# Patient Record
Sex: Female | Born: 1935
Health system: Southern US, Community
[De-identification: ages and names within clinical notes are randomized; demographics above are authoritative.]

## PROBLEM LIST (undated history)

## (undated) DIAGNOSIS — I82409 Acute embolism and thrombosis of unspecified deep veins of unspecified lower extremity: Secondary | ICD-10-CM

## (undated) DIAGNOSIS — C50911 Malignant neoplasm of unspecified site of right female breast: Secondary | ICD-10-CM

## (undated) DIAGNOSIS — I10 Essential (primary) hypertension: Secondary | ICD-10-CM

## (undated) DIAGNOSIS — R519 Headache, unspecified: Secondary | ICD-10-CM

## (undated) DIAGNOSIS — E039 Hypothyroidism, unspecified: Secondary | ICD-10-CM

## (undated) DIAGNOSIS — R51 Headache: Secondary | ICD-10-CM

## (undated) DIAGNOSIS — D473 Essential (hemorrhagic) thrombocythemia: Secondary | ICD-10-CM

## (undated) HISTORY — PX: BREAST BIOPSY: SHX20

## (undated) HISTORY — DX: Essential (hemorrhagic) thrombocythemia: D47.3

## (undated) HISTORY — PX: CATARACT EXTRACTION W/ INTRAOCULAR LENS  IMPLANT, BILATERAL: SHX1307

## (undated) HISTORY — PX: MASTECTOMY: SHX3

## (undated) HISTORY — PX: REDUCTION MAMMAPLASTY: SUR839

## (undated) HISTORY — PX: DILATION AND CURETTAGE OF UTERUS: SHX78

## (undated) HISTORY — PX: VAGINAL HYSTERECTOMY: SUR661

---

## 1982-10-08 DIAGNOSIS — C50311 Malignant neoplasm of lower-inner quadrant of right female breast: Secondary | ICD-10-CM | POA: Insufficient documentation

## 1999-01-27 ENCOUNTER — Encounter: Admission: RE | Admit: 1999-01-27 | Discharge: 1999-01-27 | Payer: Self-pay | Admitting: *Deleted

## 1999-03-08 ENCOUNTER — Encounter: Payer: Self-pay | Admitting: Orthopedic Surgery

## 1999-03-15 ENCOUNTER — Observation Stay (HOSPITAL_COMMUNITY): Admission: RE | Admit: 1999-03-15 | Discharge: 1999-03-16 | Payer: Self-pay | Admitting: Orthopedic Surgery

## 1999-10-13 ENCOUNTER — Other Ambulatory Visit: Admission: RE | Admit: 1999-10-13 | Discharge: 1999-10-13 | Payer: Self-pay | Admitting: Plastic Surgery

## 1999-10-13 ENCOUNTER — Encounter (INDEPENDENT_AMBULATORY_CARE_PROVIDER_SITE_OTHER): Payer: Self-pay

## 2000-02-03 ENCOUNTER — Encounter: Admission: RE | Admit: 2000-02-03 | Discharge: 2000-02-03 | Payer: Self-pay | Admitting: Internal Medicine

## 2000-02-03 ENCOUNTER — Encounter: Payer: Self-pay | Admitting: Internal Medicine

## 2001-02-04 ENCOUNTER — Encounter: Payer: Self-pay | Admitting: Internal Medicine

## 2001-02-04 ENCOUNTER — Encounter: Admission: RE | Admit: 2001-02-04 | Discharge: 2001-02-04 | Payer: Self-pay | Admitting: Internal Medicine

## 2001-03-06 ENCOUNTER — Ambulatory Visit (HOSPITAL_COMMUNITY): Admission: RE | Admit: 2001-03-06 | Discharge: 2001-03-06 | Payer: Self-pay | Admitting: *Deleted

## 2001-03-06 ENCOUNTER — Encounter (INDEPENDENT_AMBULATORY_CARE_PROVIDER_SITE_OTHER): Payer: Self-pay | Admitting: Specialist

## 2002-02-18 ENCOUNTER — Encounter: Payer: Self-pay | Admitting: Internal Medicine

## 2002-02-18 ENCOUNTER — Encounter: Admission: RE | Admit: 2002-02-18 | Discharge: 2002-02-18 | Payer: Self-pay | Admitting: Internal Medicine

## 2003-02-27 ENCOUNTER — Encounter: Admission: RE | Admit: 2003-02-27 | Discharge: 2003-02-27 | Payer: Self-pay | Admitting: Internal Medicine

## 2004-01-20 ENCOUNTER — Emergency Department (HOSPITAL_COMMUNITY): Admission: EM | Admit: 2004-01-20 | Discharge: 2004-01-20 | Payer: Self-pay | Admitting: Family Medicine

## 2004-01-21 ENCOUNTER — Emergency Department (HOSPITAL_COMMUNITY): Admission: EM | Admit: 2004-01-21 | Discharge: 2004-01-21 | Payer: Self-pay | Admitting: Emergency Medicine

## 2004-01-21 ENCOUNTER — Ambulatory Visit (HOSPITAL_COMMUNITY): Admission: RE | Admit: 2004-01-21 | Discharge: 2004-01-21 | Payer: Self-pay | Admitting: Sports Medicine

## 2004-02-11 ENCOUNTER — Ambulatory Visit (HOSPITAL_COMMUNITY): Admission: RE | Admit: 2004-02-11 | Discharge: 2004-02-11 | Payer: Self-pay | Admitting: Internal Medicine

## 2004-04-01 ENCOUNTER — Ambulatory Visit (HOSPITAL_COMMUNITY): Admission: RE | Admit: 2004-04-01 | Discharge: 2004-04-01 | Payer: Self-pay | Admitting: Internal Medicine

## 2004-07-07 ENCOUNTER — Encounter: Admission: RE | Admit: 2004-07-07 | Discharge: 2004-07-07 | Payer: Self-pay | Admitting: Internal Medicine

## 2005-04-06 ENCOUNTER — Encounter: Admission: RE | Admit: 2005-04-06 | Discharge: 2005-04-06 | Payer: Self-pay | Admitting: Internal Medicine

## 2005-05-31 ENCOUNTER — Encounter: Admission: RE | Admit: 2005-05-31 | Discharge: 2005-05-31 | Payer: Self-pay | Admitting: Internal Medicine

## 2006-04-09 ENCOUNTER — Ambulatory Visit (HOSPITAL_COMMUNITY): Admission: RE | Admit: 2006-04-09 | Discharge: 2006-04-09 | Payer: Self-pay | Admitting: Internal Medicine

## 2007-04-11 ENCOUNTER — Ambulatory Visit (HOSPITAL_COMMUNITY): Admission: RE | Admit: 2007-04-11 | Discharge: 2007-04-11 | Payer: Self-pay | Admitting: Internal Medicine

## 2008-04-13 ENCOUNTER — Ambulatory Visit (HOSPITAL_COMMUNITY): Admission: RE | Admit: 2008-04-13 | Discharge: 2008-04-13 | Payer: Self-pay | Admitting: Internal Medicine

## 2008-09-26 ENCOUNTER — Emergency Department (HOSPITAL_COMMUNITY): Admission: EM | Admit: 2008-09-26 | Discharge: 2008-09-26 | Payer: Self-pay | Admitting: Emergency Medicine

## 2008-10-02 ENCOUNTER — Encounter: Admission: RE | Admit: 2008-10-02 | Discharge: 2008-10-02 | Payer: Self-pay | Admitting: Internal Medicine

## 2008-11-03 ENCOUNTER — Encounter: Admission: RE | Admit: 2008-11-03 | Discharge: 2008-11-03 | Payer: Self-pay | Admitting: Endocrinology

## 2008-11-03 ENCOUNTER — Encounter (INDEPENDENT_AMBULATORY_CARE_PROVIDER_SITE_OTHER): Payer: Self-pay | Admitting: Interventional Radiology

## 2008-11-03 ENCOUNTER — Other Ambulatory Visit: Admission: RE | Admit: 2008-11-03 | Discharge: 2008-11-03 | Payer: Self-pay | Admitting: Interventional Radiology

## 2009-05-04 ENCOUNTER — Ambulatory Visit (HOSPITAL_COMMUNITY): Admission: RE | Admit: 2009-05-04 | Discharge: 2009-05-04 | Payer: Self-pay | Admitting: Internal Medicine

## 2010-03-14 ENCOUNTER — Ambulatory Visit: Payer: Self-pay | Admitting: Oncology

## 2010-03-29 ENCOUNTER — Other Ambulatory Visit: Payer: Self-pay | Admitting: Oncology

## 2010-03-29 ENCOUNTER — Encounter (HOSPITAL_BASED_OUTPATIENT_CLINIC_OR_DEPARTMENT_OTHER): Payer: Medicare Other | Admitting: Oncology

## 2010-03-29 DIAGNOSIS — C50919 Malignant neoplasm of unspecified site of unspecified female breast: Secondary | ICD-10-CM

## 2010-03-29 DIAGNOSIS — D473 Essential (hemorrhagic) thrombocythemia: Secondary | ICD-10-CM

## 2010-03-29 DIAGNOSIS — D6859 Other primary thrombophilia: Secondary | ICD-10-CM

## 2010-03-29 LAB — CBC WITH DIFFERENTIAL/PLATELET
BASO%: 0.4 % (ref 0.0–2.0)
Basophils Absolute: 0 10*3/uL (ref 0.0–0.1)
EOS%: 2.3 % (ref 0.0–7.0)
Eosinophils Absolute: 0.2 10*3/uL (ref 0.0–0.5)
HCT: 46.5 % (ref 34.8–46.6)
LYMPH%: 26.1 % (ref 14.0–49.7)
MCHC: 32.3 g/dL (ref 31.5–36.0)
MONO#: 0.5 10*3/uL (ref 0.1–0.9)
NEUT#: 4.8 10*3/uL (ref 1.5–6.5)
NEUT%: 64.3 % (ref 38.4–76.8)
Platelets: 781 10*3/uL — ABNORMAL HIGH (ref 145–400)
RDW: 18.7 % — ABNORMAL HIGH (ref 11.2–14.5)
lymph#: 1.9 10*3/uL (ref 0.9–3.3)

## 2010-03-29 LAB — COMPREHENSIVE METABOLIC PANEL
AST: 26 U/L (ref 0–37)
Calcium: 9.3 mg/dL (ref 8.4–10.5)
Chloride: 107 mEq/L (ref 96–112)
Sodium: 143 mEq/L (ref 135–145)
Total Protein: 7.1 g/dL (ref 6.0–8.3)

## 2010-03-29 LAB — MORPHOLOGY

## 2010-03-30 ENCOUNTER — Other Ambulatory Visit (HOSPITAL_COMMUNITY): Payer: Self-pay | Admitting: Internal Medicine

## 2010-03-30 DIAGNOSIS — Z1231 Encounter for screening mammogram for malignant neoplasm of breast: Secondary | ICD-10-CM

## 2010-04-01 LAB — HYPERCOAGULABLE PANEL, COMPREHENSIVE
Anticardiolipin IgA: 3 APL U/mL (ref <22)
Anticardiolipin IgM: 19 MPL U/mL — ABNORMAL HIGH (ref <11)
Beta-2-Glycoprotein I IgA: 8 A Units (ref <20)
Beta-2-Glycoprotein I IgM: 34 M Units — ABNORMAL HIGH (ref <20)
DRVVT 1:1 Mix: 41 secs (ref 36.2–44.3)
PTT Lupus Anticoagulant: 48.6 secs — ABNORMAL HIGH (ref 30.0–45.6)
PTTLA 4:1 Mix: 40.4 secs (ref 30.0–45.6)
Protein C, Total: 63 % — ABNORMAL LOW (ref 70–140)

## 2010-04-01 LAB — IRON AND TIBC: %SAT: 26 % (ref 20–55)

## 2010-04-01 LAB — CANCER ANTIGEN 27.29: CA 27.29: 27 U/mL (ref 0–39)

## 2010-04-11 LAB — HYPERCOAGULABLE PANEL, COMPREHENSIVE
Anticardiolipin IgA: 3 APL U/mL (ref <22)
Beta-2-Glycoprotein I IgM: 34 M Units — ABNORMAL HIGH (ref <20)
DRVVT 1:1 Mix: 41 secs (ref 36.2–44.3)
DRVVT: 59.1 secs — ABNORMAL HIGH (ref 36.2–44.3)
PTTLA 4:1 Mix: 40.4 secs (ref 30.0–45.6)
Protein C Activity: 18 % — ABNORMAL LOW (ref 75–133)
Protein C, Total: 63 % — ABNORMAL LOW (ref 70–140)
Protein S Activity: 34 % — ABNORMAL LOW (ref 69–129)

## 2010-04-11 LAB — IRON AND TIBC
%SAT: 26 % (ref 20–55)
Iron: 74 ug/dL (ref 42–145)

## 2010-04-11 LAB — FERRITIN: Ferritin: 110 ng/mL (ref 10–291)

## 2010-04-15 ENCOUNTER — Encounter (HOSPITAL_BASED_OUTPATIENT_CLINIC_OR_DEPARTMENT_OTHER): Payer: Medicare Other | Admitting: Oncology

## 2010-04-15 DIAGNOSIS — D6859 Other primary thrombophilia: Secondary | ICD-10-CM

## 2010-04-15 DIAGNOSIS — C50919 Malignant neoplasm of unspecified site of unspecified female breast: Secondary | ICD-10-CM

## 2010-04-15 DIAGNOSIS — D473 Essential (hemorrhagic) thrombocythemia: Secondary | ICD-10-CM

## 2010-04-20 ENCOUNTER — Other Ambulatory Visit: Payer: Self-pay | Admitting: Oncology

## 2010-04-20 DIAGNOSIS — D75839 Thrombocytosis, unspecified: Secondary | ICD-10-CM

## 2010-04-20 DIAGNOSIS — D473 Essential (hemorrhagic) thrombocythemia: Secondary | ICD-10-CM

## 2010-04-26 ENCOUNTER — Other Ambulatory Visit: Payer: Self-pay | Admitting: Oncology

## 2010-04-26 ENCOUNTER — Other Ambulatory Visit: Payer: Self-pay | Admitting: Diagnostic Radiology

## 2010-04-26 ENCOUNTER — Ambulatory Visit (HOSPITAL_COMMUNITY)
Admission: RE | Admit: 2010-04-26 | Discharge: 2010-04-26 | Disposition: A | Discharge status: Home / Self Care | Payer: Medicare Other | Source: Ambulatory Visit | Attending: Oncology | Admitting: Oncology

## 2010-04-26 ENCOUNTER — Ambulatory Visit (HOSPITAL_COMMUNITY): Payer: Medicare Other

## 2010-04-26 DIAGNOSIS — D473 Essential (hemorrhagic) thrombocythemia: Secondary | ICD-10-CM | POA: Insufficient documentation

## 2010-04-26 DIAGNOSIS — D75839 Thrombocytosis, unspecified: Secondary | ICD-10-CM

## 2010-04-26 LAB — CBC
HCT: 44.8 % (ref 36.0–46.0)
MCH: 26.8 pg (ref 26.0–34.0)
MCV: 81.8 fL (ref 78.0–100.0)
RBC: 5.48 MIL/uL — ABNORMAL HIGH (ref 3.87–5.11)
WBC: 9.7 10*3/uL (ref 4.0–10.5)

## 2010-04-26 LAB — APTT: aPTT: 32 seconds (ref 24–37)

## 2010-04-27 ENCOUNTER — Other Ambulatory Visit (HOSPITAL_COMMUNITY): Payer: Medicare Other

## 2010-05-09 ENCOUNTER — Ambulatory Visit (HOSPITAL_COMMUNITY)
Admission: RE | Admit: 2010-05-09 | Discharge: 2010-05-09 | Disposition: A | Discharge status: Home / Self Care | Payer: Medicare Other | Source: Ambulatory Visit | Attending: Internal Medicine | Admitting: Internal Medicine

## 2010-05-09 DIAGNOSIS — Z1231 Encounter for screening mammogram for malignant neoplasm of breast: Secondary | ICD-10-CM

## 2010-05-22 LAB — BASIC METABOLIC PANEL
BUN: 16 mg/dL (ref 6–23)
CO2: 28 mEq/L (ref 19–32)
Calcium: 9.4 mg/dL (ref 8.4–10.5)
Chloride: 108 mEq/L (ref 96–112)
Creatinine, Ser: 0.98 mg/dL (ref 0.4–1.2)
GFR calc Af Amer: >60 mL/min (ref >60)
GFR calc non Af Amer: 56 mL/min — ABNORMAL LOW (ref >60)
Glucose, Bld: 102 mg/dL — ABNORMAL HIGH (ref 70–99)
Potassium: 3.7 mEq/L (ref 3.5–5.1)
Sodium: 142 mEq/L (ref 135–145)

## 2010-05-22 LAB — DIFFERENTIAL
Blasts: 0 %
Lymphocytes Relative: 26 % (ref 12–46)
Metamyelocytes Relative: 0 %
Monocytes Absolute: 0.7 10*3/uL (ref 0.1–1.0)
Monocytes Relative: 8 % (ref 3–12)
Neutrophils Relative %: 63 % (ref 43–77)
nRBC: 0 /100 WBC

## 2010-05-22 LAB — CBC
HCT: 46.6 % — ABNORMAL HIGH (ref 36.0–46.0)
Hemoglobin: 15.4 g/dL — ABNORMAL HIGH (ref 12.0–15.0)
MCHC: 33 g/dL (ref 30.0–36.0)
MCV: 85.7 fL (ref 78.0–100.0)
Platelets: 808 10*3/uL — ABNORMAL HIGH (ref 150–400)
RBC: 5.44 MIL/uL — ABNORMAL HIGH (ref 3.87–5.11)
RDW: 18 % — ABNORMAL HIGH (ref 11.5–15.5)
WBC: 8.3 10*3/uL (ref 4.0–10.5)

## 2010-05-22 LAB — POCT CARDIAC MARKERS
CKMB, poc: <1 ng/mL — ABNORMAL LOW (ref 1.0–8.0)
Myoglobin, poc: 56 ng/mL (ref 12–200)
Myoglobin, poc: 63.9 ng/mL (ref 12–200)

## 2010-05-22 LAB — PROTIME-INR: Prothrombin Time: 23.9 seconds — ABNORMAL HIGH (ref 11.6–15.2)

## 2010-06-07 ENCOUNTER — Other Ambulatory Visit: Payer: Self-pay | Admitting: Oncology

## 2010-06-07 ENCOUNTER — Encounter (HOSPITAL_BASED_OUTPATIENT_CLINIC_OR_DEPARTMENT_OTHER): Payer: Medicare Other | Admitting: Oncology

## 2010-06-07 DIAGNOSIS — Z853 Personal history of malignant neoplasm of breast: Secondary | ICD-10-CM

## 2010-06-07 DIAGNOSIS — D473 Essential (hemorrhagic) thrombocythemia: Secondary | ICD-10-CM

## 2010-06-07 DIAGNOSIS — Z7901 Long term (current) use of anticoagulants: Secondary | ICD-10-CM

## 2010-06-07 DIAGNOSIS — C50919 Malignant neoplasm of unspecified site of unspecified female breast: Secondary | ICD-10-CM

## 2010-06-07 DIAGNOSIS — D6859 Other primary thrombophilia: Secondary | ICD-10-CM

## 2010-06-07 LAB — CBC WITH DIFFERENTIAL/PLATELET
Basophils Absolute: 0.1 10*3/uL (ref 0.0–0.1)
Eosinophils Absolute: 0.2 10*3/uL (ref 0.0–0.5)
HGB: 15.7 g/dL (ref 11.6–15.9)
LYMPH%: 26.4 % (ref 14.0–49.7)
MCV: 80.7 fL (ref 79.5–101.0)
MONO%: 9 % (ref 0.0–14.0)
NEUT#: 5.6 10*3/uL (ref 1.5–6.5)
NEUT%: 61.2 % (ref 38.4–76.8)
Platelets: 710 10*3/uL — ABNORMAL HIGH (ref 145–400)

## 2010-06-08 ENCOUNTER — Other Ambulatory Visit: Payer: Self-pay | Admitting: Endocrinology

## 2010-06-08 DIAGNOSIS — E042 Nontoxic multinodular goiter: Secondary | ICD-10-CM

## 2010-06-13 ENCOUNTER — Encounter: Payer: Medicare Other | Admitting: Genetic Counselor

## 2010-06-15 ENCOUNTER — Encounter (HOSPITAL_BASED_OUTPATIENT_CLINIC_OR_DEPARTMENT_OTHER): Payer: Medicare Other | Admitting: Oncology

## 2010-06-15 ENCOUNTER — Other Ambulatory Visit: Payer: Self-pay | Admitting: Oncology

## 2010-06-15 DIAGNOSIS — D6859 Other primary thrombophilia: Secondary | ICD-10-CM

## 2010-06-15 DIAGNOSIS — C50919 Malignant neoplasm of unspecified site of unspecified female breast: Secondary | ICD-10-CM

## 2010-06-15 DIAGNOSIS — D473 Essential (hemorrhagic) thrombocythemia: Secondary | ICD-10-CM

## 2010-06-15 LAB — CBC WITH DIFFERENTIAL/PLATELET
BASO%: 0.9 % (ref 0.0–2.0)
EOS%: 2.2 % (ref 0.0–7.0)
HCT: 43.3 % (ref 34.8–46.6)
LYMPH%: 34.3 % (ref 14.0–49.7)
MCH: 26.6 pg (ref 25.1–34.0)
MCHC: 33.3 g/dL (ref 31.5–36.0)
NEUT%: 54.9 % (ref 38.4–76.8)
Platelets: 668 10*3/uL — ABNORMAL HIGH (ref 145–400)

## 2010-06-22 ENCOUNTER — Other Ambulatory Visit: Payer: Self-pay | Admitting: Oncology

## 2010-06-22 ENCOUNTER — Encounter (HOSPITAL_BASED_OUTPATIENT_CLINIC_OR_DEPARTMENT_OTHER): Payer: Medicare Other | Admitting: Oncology

## 2010-06-22 DIAGNOSIS — D6859 Other primary thrombophilia: Secondary | ICD-10-CM

## 2010-06-22 DIAGNOSIS — C50919 Malignant neoplasm of unspecified site of unspecified female breast: Secondary | ICD-10-CM

## 2010-06-22 DIAGNOSIS — D473 Essential (hemorrhagic) thrombocythemia: Secondary | ICD-10-CM

## 2010-06-22 LAB — CBC WITH DIFFERENTIAL/PLATELET
Basophils Absolute: 0 10*3/uL (ref 0.0–0.1)
EOS%: 2.2 % (ref 0.0–7.0)
HGB: 14.4 g/dL (ref 11.6–15.9)
LYMPH%: 30.5 % (ref 14.0–49.7)
MCH: 27.2 pg (ref 25.1–34.0)
MCV: 83.2 fL (ref 79.5–101.0)
MONO%: 6.8 % (ref 0.0–14.0)
Platelets: 703 10*3/uL — ABNORMAL HIGH (ref 145–400)
RBC: 5.29 10*6/uL (ref 3.70–5.45)
RDW: 17.9 % — ABNORMAL HIGH (ref 11.2–14.5)

## 2010-06-29 ENCOUNTER — Other Ambulatory Visit: Payer: Self-pay | Admitting: Oncology

## 2010-06-29 ENCOUNTER — Encounter (HOSPITAL_BASED_OUTPATIENT_CLINIC_OR_DEPARTMENT_OTHER): Payer: Medicare Other | Admitting: Oncology

## 2010-06-29 DIAGNOSIS — D473 Essential (hemorrhagic) thrombocythemia: Secondary | ICD-10-CM

## 2010-06-29 DIAGNOSIS — D6859 Other primary thrombophilia: Secondary | ICD-10-CM

## 2010-06-29 DIAGNOSIS — C50919 Malignant neoplasm of unspecified site of unspecified female breast: Secondary | ICD-10-CM

## 2010-06-29 LAB — CBC WITH DIFFERENTIAL/PLATELET
BASO%: 0.6 % (ref 0.0–2.0)
EOS%: 2 % (ref 0.0–7.0)
Eosinophils Absolute: 0.1 10*3/uL (ref 0.0–0.5)
MCHC: 33.7 g/dL (ref 31.5–36.0)
MCV: 83.3 fL (ref 79.5–101.0)
MONO%: 6 % (ref 0.0–14.0)
NEUT#: 4.1 10*3/uL (ref 1.5–6.5)
RBC: 5.21 10*6/uL (ref 3.70–5.45)
RDW: 17.7 % — ABNORMAL HIGH (ref 11.2–14.5)

## 2010-07-01 NOTE — Procedures (Signed)
[facility]  Patient:    Lori Page, Lori Page Visit Number: [id number] MRN: [id number]          Service Type: END Location: ENDO Attending Physician:  Sabino Gasser Dictated by:   Sabino Gasser, M.D. Proc. Date: 03/06/01 Admit Date:  03/06/2001                             Procedure Report  PROCEDURE PERFORMED:  Upper endoscopy.  ENDOSCOPIST:  Sabino Gasser, M.D.  INDICATIONS FOR PROCEDURE:  Hemoccult positivity.  ANESTHESIA:  Demerol 75 mg, Versed 6 mg total.  DESCRIPTION OF PROCEDURE:  With the patient mildly sedated in the left lateral decubitus position, the Olympus video endoscope was inserted in the mouth and passed under direct vision through the esophagus which appeared normal.  We entered into the stomach.  The fundus and body all appeared normal.  The antrum, however, was quite erythematous with small ulcerations, one of which had a black top and blood flecks were seen in the stomach.  All of this may have account for his hemoccult positivity.  We advanced to the duodenal bulb and second portion of the duodenum, which appeared normal.  From this point, the endoscope was slowly withdrawn taking circumferential views of the entire duodenal mucosa until the endoscope was placed on retroflexion after pulling back to the stomach.  The endoscope was then straightened and withdrawn taking circumferential views of the remaining gastric and esophageal mucosa. Patients vital signs and pulse oximeter remained stable.  The patient tolerated the procedure well without apparent complications.  FINDINGS:  Ulceration, gastritis of the antrum.  Await biopsy report.  Patient will call me for results and follow up with me as an outpatient.  Proceed too colonoscopy as planned. Dictated by:   Sabino Gasser, M.D. Attending Physician:  Sabino Gasser DD:  03/06/01 TD:  03/07/01 Job: [id number]

## 2010-07-01 NOTE — Procedures (Signed)
[facility]  Patient:    Lori Page, Lori Page Visit Number: [id number] MRN: [id number]          Service Type: END Location: ENDO Attending Physician:  Sabino Gasser Dictated by:   Sabino Gasser, M.D. Proc. Date: 03/06/01 Admit Date:  03/06/2001                             Procedure Report  PROCEDURE:  Colonoscopy.  INDICATION FOR PROCEDURE:  Hemoccult positivity.  ANESTHESIA:  A total of 75 mg of Demerol and 6 mg of Versed were given for two procedures.  DESCRIPTION OF PROCEDURE:  With the patient mildly sedated in the left lateral decubitus position, a rectal exam was performed which was unremarkable. Subsequently, the Olympus videoscopic colonoscope was inserted in the rectum and passed under direct vision to the cecum identified by the ileocecal valve and appendiceal orifice both of which were photographed. From this point, the colonoscope was slowly withdrawn taking circumferential views of the entire colonic mucosa, stopping only then in the rectum which appeared normal in direct view and showed small internal hemorrhoid on retroflexed view. The endoscope was straightened and withdrawn. The patients vital signs and pulse oximeter remained stable. The patient tolerated the procedure well without apparent complications.  FINDINGS:  Internal hemorrhoids otherwise unremarkable examination.  PLAN:  See endoscopy note for further details. Dictated by:   Sabino Gasser, M.D. Attending Physician:  Sabino Gasser DD:  03/06/01 TD:  03/07/01 Job: [id number]

## 2010-07-06 ENCOUNTER — Other Ambulatory Visit: Payer: Self-pay | Admitting: Oncology

## 2010-07-06 ENCOUNTER — Encounter (HOSPITAL_BASED_OUTPATIENT_CLINIC_OR_DEPARTMENT_OTHER): Payer: Medicare Other | Admitting: Oncology

## 2010-07-06 DIAGNOSIS — D6859 Other primary thrombophilia: Secondary | ICD-10-CM

## 2010-07-06 DIAGNOSIS — C50919 Malignant neoplasm of unspecified site of unspecified female breast: Secondary | ICD-10-CM

## 2010-07-06 DIAGNOSIS — D473 Essential (hemorrhagic) thrombocythemia: Secondary | ICD-10-CM

## 2010-07-06 LAB — CBC WITH DIFFERENTIAL/PLATELET
BASO%: 0.6 % (ref 0.0–2.0)
Eosinophils Absolute: 0.2 10*3/uL (ref 0.0–0.5)
MCHC: 33.1 g/dL (ref 31.5–36.0)
MONO#: 0.5 10*3/uL (ref 0.1–0.9)
NEUT#: 3.5 10*3/uL (ref 1.5–6.5)
RBC: 5.1 10*6/uL (ref 3.70–5.45)
RDW: 19.5 % — ABNORMAL HIGH (ref 11.2–14.5)
WBC: 6.1 10*3/uL (ref 3.9–10.3)
lymph#: 2 10*3/uL (ref 0.9–3.3)

## 2010-07-13 ENCOUNTER — Encounter (HOSPITAL_BASED_OUTPATIENT_CLINIC_OR_DEPARTMENT_OTHER): Payer: Medicare Other | Admitting: Oncology

## 2010-07-13 ENCOUNTER — Other Ambulatory Visit: Payer: Self-pay | Admitting: Oncology

## 2010-07-13 DIAGNOSIS — D6859 Other primary thrombophilia: Secondary | ICD-10-CM

## 2010-07-13 DIAGNOSIS — D473 Essential (hemorrhagic) thrombocythemia: Secondary | ICD-10-CM

## 2010-07-13 DIAGNOSIS — C50919 Malignant neoplasm of unspecified site of unspecified female breast: Secondary | ICD-10-CM

## 2010-07-13 LAB — CBC WITH DIFFERENTIAL/PLATELET
BASO%: 1.1 % (ref 0.0–2.0)
Eosinophils Absolute: 0.2 10*3/uL (ref 0.0–0.5)
HCT: 43.8 % (ref 34.8–46.6)
LYMPH%: 39 % (ref 14.0–49.7)
MONO#: 0.6 10*3/uL (ref 0.1–0.9)
NEUT#: 2.5 10*3/uL (ref 1.5–6.5)
NEUT%: 45.6 % (ref 38.4–76.8)
Platelets: 340 10*3/uL (ref 145–400)
WBC: 5.4 10*3/uL (ref 3.9–10.3)
lymph#: 2.1 10*3/uL (ref 0.9–3.3)

## 2010-07-18 ENCOUNTER — Ambulatory Visit
Admission: RE | Admit: 2010-07-18 | Discharge: 2010-07-18 | Disposition: A | Discharge status: Home / Self Care | Payer: Medicare Other | Source: Ambulatory Visit | Attending: Endocrinology | Admitting: Endocrinology

## 2010-07-18 DIAGNOSIS — E042 Nontoxic multinodular goiter: Secondary | ICD-10-CM

## 2010-07-26 ENCOUNTER — Other Ambulatory Visit: Payer: Self-pay | Admitting: Endocrinology

## 2010-07-26 DIAGNOSIS — E042 Nontoxic multinodular goiter: Secondary | ICD-10-CM

## 2010-09-07 ENCOUNTER — Other Ambulatory Visit: Payer: Self-pay | Admitting: Oncology

## 2010-09-07 ENCOUNTER — Encounter (HOSPITAL_BASED_OUTPATIENT_CLINIC_OR_DEPARTMENT_OTHER): Payer: Medicare Other | Admitting: Oncology

## 2010-09-07 DIAGNOSIS — D6859 Other primary thrombophilia: Secondary | ICD-10-CM

## 2010-09-07 DIAGNOSIS — C50919 Malignant neoplasm of unspecified site of unspecified female breast: Secondary | ICD-10-CM

## 2010-09-07 DIAGNOSIS — Z853 Personal history of malignant neoplasm of breast: Secondary | ICD-10-CM

## 2010-09-07 DIAGNOSIS — D473 Essential (hemorrhagic) thrombocythemia: Secondary | ICD-10-CM

## 2010-09-07 LAB — CBC WITH DIFFERENTIAL/PLATELET
BASO%: 0.2 % (ref 0.0–2.0)
Basophils Absolute: 0 10*3/uL (ref 0.0–0.1)
HCT: 41 % (ref 34.8–46.6)
HGB: 13.9 g/dL (ref 11.6–15.9)
LYMPH%: 41.6 % (ref 14.0–49.7)
MCH: 30.1 pg (ref 25.1–34.0)
MCHC: 33.9 g/dL (ref 31.5–36.0)
MONO#: 0.3 10*3/uL (ref 0.1–0.9)
NEUT%: 50.6 % (ref 38.4–76.8)
Platelets: 261 10*3/uL (ref 145–400)
WBC: 4.7 10*3/uL (ref 3.9–10.3)
lymph#: 2 10*3/uL (ref 0.9–3.3)

## 2010-09-07 LAB — COMPREHENSIVE METABOLIC PANEL
BUN: 15 mg/dL (ref 6–23)
CO2: 24 mEq/L (ref 19–32)
Calcium: 9.1 mg/dL (ref 8.4–10.5)
Chloride: 106 mEq/L (ref 96–112)
Creatinine, Ser: 1.18 mg/dL — ABNORMAL HIGH (ref 0.50–1.10)
Total Bilirubin: 0.3 mg/dL (ref 0.3–1.2)

## 2010-09-29 ENCOUNTER — Other Ambulatory Visit: Payer: Self-pay | Admitting: Oncology

## 2010-09-29 ENCOUNTER — Encounter (HOSPITAL_BASED_OUTPATIENT_CLINIC_OR_DEPARTMENT_OTHER): Payer: Medicare Other | Admitting: Oncology

## 2010-09-29 DIAGNOSIS — D473 Essential (hemorrhagic) thrombocythemia: Secondary | ICD-10-CM

## 2010-09-29 DIAGNOSIS — D6859 Other primary thrombophilia: Secondary | ICD-10-CM

## 2010-09-29 DIAGNOSIS — C50919 Malignant neoplasm of unspecified site of unspecified female breast: Secondary | ICD-10-CM

## 2010-09-29 DIAGNOSIS — Z853 Personal history of malignant neoplasm of breast: Secondary | ICD-10-CM

## 2010-09-29 LAB — CBC WITH DIFFERENTIAL/PLATELET
BASO%: 0.8 % (ref 0.0–2.0)
Basophils Absolute: 0 10*3/uL (ref 0.0–0.1)
Eosinophils Absolute: 0.1 10*3/uL (ref 0.0–0.5)
HCT: 42.5 % (ref 34.8–46.6)
HGB: 14.3 g/dL (ref 11.6–15.9)
LYMPH%: 41.6 % (ref 14.0–49.7)
MONO#: 0.5 10*3/uL (ref 0.1–0.9)
NEUT#: 2.2 10*3/uL (ref 1.5–6.5)
NEUT%: 45.9 % (ref 38.4–76.8)
Platelets: 263 10*3/uL (ref 145–400)
WBC: 4.8 10*3/uL (ref 3.9–10.3)
lymph#: 2 10*3/uL (ref 0.9–3.3)

## 2010-11-03 ENCOUNTER — Other Ambulatory Visit: Payer: Self-pay | Admitting: Oncology

## 2010-11-03 ENCOUNTER — Encounter (HOSPITAL_BASED_OUTPATIENT_CLINIC_OR_DEPARTMENT_OTHER): Payer: Medicare Other | Admitting: Oncology

## 2010-11-03 DIAGNOSIS — Z853 Personal history of malignant neoplasm of breast: Secondary | ICD-10-CM

## 2010-11-03 DIAGNOSIS — D6859 Other primary thrombophilia: Secondary | ICD-10-CM

## 2010-11-03 DIAGNOSIS — C50919 Malignant neoplasm of unspecified site of unspecified female breast: Secondary | ICD-10-CM

## 2010-11-03 DIAGNOSIS — D473 Essential (hemorrhagic) thrombocythemia: Secondary | ICD-10-CM

## 2010-11-03 LAB — CBC WITH DIFFERENTIAL/PLATELET
Basophils Absolute: 0 10*3/uL (ref 0.0–0.1)
EOS%: 0.3 % (ref 0.0–7.0)
HCT: 42.2 % (ref 34.8–46.6)
HGB: 14.3 g/dL (ref 11.6–15.9)
LYMPH%: 32.4 % (ref 14.0–49.7)
MCH: 33.4 pg (ref 25.1–34.0)
NEUT%: 54.9 % (ref 38.4–76.8)
Platelets: 228 10*3/uL (ref 145–400)
lymph#: 2.3 10*3/uL (ref 0.9–3.3)

## 2010-12-01 ENCOUNTER — Other Ambulatory Visit: Payer: Self-pay | Admitting: Oncology

## 2010-12-01 ENCOUNTER — Encounter (HOSPITAL_BASED_OUTPATIENT_CLINIC_OR_DEPARTMENT_OTHER): Payer: Medicare Other | Admitting: Oncology

## 2010-12-01 DIAGNOSIS — D473 Essential (hemorrhagic) thrombocythemia: Secondary | ICD-10-CM

## 2010-12-01 DIAGNOSIS — D6859 Other primary thrombophilia: Secondary | ICD-10-CM

## 2010-12-01 DIAGNOSIS — Z853 Personal history of malignant neoplasm of breast: Secondary | ICD-10-CM

## 2010-12-01 DIAGNOSIS — C50919 Malignant neoplasm of unspecified site of unspecified female breast: Secondary | ICD-10-CM

## 2010-12-01 LAB — CBC WITH DIFFERENTIAL/PLATELET
Basophils Absolute: 0 10*3/uL (ref 0.0–0.1)
EOS%: 1 % (ref 0.0–7.0)
HCT: 41.2 % (ref 34.8–46.6)
HGB: 13.7 g/dL (ref 11.6–15.9)
MCH: 35.3 pg — ABNORMAL HIGH (ref 25.1–34.0)
MCHC: 33.3 g/dL (ref 31.5–36.0)
MCV: 106.2 fL — ABNORMAL HIGH (ref 79.5–101.0)
MONO%: 8.6 % (ref 0.0–14.0)
NEUT%: 43.4 % (ref 38.4–76.8)
RDW: 15.1 % — ABNORMAL HIGH (ref 11.2–14.5)

## 2010-12-29 ENCOUNTER — Other Ambulatory Visit: Payer: Self-pay | Admitting: Oncology

## 2010-12-29 ENCOUNTER — Other Ambulatory Visit (HOSPITAL_BASED_OUTPATIENT_CLINIC_OR_DEPARTMENT_OTHER): Payer: Medicare Other | Admitting: Lab

## 2010-12-29 DIAGNOSIS — D6859 Other primary thrombophilia: Secondary | ICD-10-CM

## 2010-12-29 DIAGNOSIS — C50919 Malignant neoplasm of unspecified site of unspecified female breast: Secondary | ICD-10-CM

## 2010-12-29 DIAGNOSIS — Z853 Personal history of malignant neoplasm of breast: Secondary | ICD-10-CM

## 2010-12-29 DIAGNOSIS — D473 Essential (hemorrhagic) thrombocythemia: Secondary | ICD-10-CM

## 2010-12-29 LAB — CBC WITH DIFFERENTIAL/PLATELET
BASO%: 0.5 % (ref 0.0–2.0)
EOS%: 0.9 % (ref 0.0–7.0)
MCH: 35.5 pg — ABNORMAL HIGH (ref 25.1–34.0)
MCV: 107 fL — ABNORMAL HIGH (ref 79.5–101.0)
MONO%: 9.5 % (ref 0.0–14.0)
RBC: 3.93 10*6/uL (ref 3.70–5.45)
RDW: 14.6 % — ABNORMAL HIGH (ref 11.2–14.5)

## 2011-01-16 ENCOUNTER — Ambulatory Visit
Admission: RE | Admit: 2011-01-16 | Discharge: 2011-01-16 | Disposition: A | Discharge status: Home / Self Care | Payer: Medicare Other | Source: Ambulatory Visit | Attending: Endocrinology | Admitting: Endocrinology

## 2011-01-16 DIAGNOSIS — E042 Nontoxic multinodular goiter: Secondary | ICD-10-CM

## 2011-01-17 ENCOUNTER — Telehealth: Payer: Self-pay | Admitting: Oncology

## 2011-01-17 NOTE — Telephone Encounter (Signed)
called pts lmovm for appts for dec-jan2013. asked pt to rtn call to confirm appts

## 2011-01-17 NOTE — Telephone Encounter (Signed)
pt rtn call and confirmed appts fro dec-jan2013

## 2011-01-23 ENCOUNTER — Other Ambulatory Visit: Payer: Medicare Other

## 2011-02-02 ENCOUNTER — Other Ambulatory Visit: Payer: Self-pay | Admitting: Oncology

## 2011-02-02 ENCOUNTER — Other Ambulatory Visit: Payer: Medicare Other | Admitting: Lab

## 2011-02-02 LAB — CBC WITH DIFFERENTIAL/PLATELET
Eosinophils Absolute: 0 10*3/uL (ref 0.0–0.5)
MONO#: 0.5 10*3/uL (ref 0.1–0.9)
MONO%: 10.8 % (ref 0.0–14.0)
NEUT#: 2 10*3/uL (ref 1.5–6.5)
RBC: 3.83 10*6/uL (ref 3.70–5.45)
RDW: 13.9 % (ref 11.2–14.5)
WBC: 4.2 10*3/uL (ref 3.9–10.3)

## 2011-03-09 ENCOUNTER — Other Ambulatory Visit (HOSPITAL_BASED_OUTPATIENT_CLINIC_OR_DEPARTMENT_OTHER): Payer: Medicare Other | Admitting: Lab

## 2011-03-09 DIAGNOSIS — C50919 Malignant neoplasm of unspecified site of unspecified female breast: Secondary | ICD-10-CM

## 2011-03-09 LAB — CBC WITH DIFFERENTIAL/PLATELET
Eosinophils Absolute: 0 10*3/uL (ref 0.0–0.5)
HCT: 41.6 % (ref 34.8–46.6)
LYMPH%: 43.7 % (ref 14.0–49.7)
MONO#: 0.4 10*3/uL (ref 0.1–0.9)
NEUT#: 1.8 10*3/uL (ref 1.5–6.5)
Platelets: 206 10*3/uL (ref 145–400)
RBC: 3.98 10*6/uL (ref 3.70–5.45)
WBC: 4 10*3/uL (ref 3.9–10.3)

## 2011-03-09 LAB — COMPREHENSIVE METABOLIC PANEL
ALT: 17 U/L (ref 0–35)
AST: 20 U/L (ref 0–37)
Albumin: 4 g/dL (ref 3.5–5.2)
BUN: 11 mg/dL (ref 6–23)
Calcium: 9 mg/dL (ref 8.4–10.5)
Chloride: 107 mEq/L (ref 96–112)
Potassium: 3.9 mEq/L (ref 3.5–5.3)
Total Protein: 6.5 g/dL (ref 6.0–8.3)

## 2011-03-09 LAB — PROTHROMBIN TIME
INR: 2.2 — ABNORMAL HIGH (ref <1.50)
Prothrombin Time: 24.8 seconds — ABNORMAL HIGH (ref 11.6–15.2)

## 2011-03-10 ENCOUNTER — Telehealth: Payer: Self-pay | Admitting: *Deleted

## 2011-03-10 ENCOUNTER — Encounter: Payer: Self-pay | Admitting: *Deleted

## 2011-03-10 NOTE — Telephone Encounter (Signed)
Called pt per MD labs look great, Keep same dose Coumadin Recheck labs as scheduled. Pt advised her Coumadin -M&F 1 1/2 tables 1 tablet other days. Coumadin is being managed by Virgina Evener at [facility]. Pt confirmed and verbalized f/u appt with MD on 03/16/11

## 2011-03-16 ENCOUNTER — Telehealth: Payer: Self-pay | Admitting: *Deleted

## 2011-03-16 ENCOUNTER — Ambulatory Visit (HOSPITAL_BASED_OUTPATIENT_CLINIC_OR_DEPARTMENT_OTHER): Payer: Medicare Other | Admitting: Oncology

## 2011-03-16 VITALS — BP 152/88 | HR 79 | Temp 98.1°F | Ht 64.0 in | Wt 225.2 lb

## 2011-03-16 DIAGNOSIS — I2699 Other pulmonary embolism without acute cor pulmonale: Secondary | ICD-10-CM

## 2011-03-16 DIAGNOSIS — Z853 Personal history of malignant neoplasm of breast: Secondary | ICD-10-CM

## 2011-03-16 DIAGNOSIS — D473 Essential (hemorrhagic) thrombocythemia: Secondary | ICD-10-CM

## 2011-03-16 NOTE — Telephone Encounter (Signed)
gave patient appointment for monthly lab 03-2011 thru 05-2011 printed out calendar and gave to the patient

## 2011-03-19 NOTE — Progress Notes (Signed)
OFFICE PROGRESS NOTE  CC Dr. Merri Brunette Dr. Adela Lank  DIAGNOSIS: 76 year old with 1. Essential thrombocytosis with Jak 2 mutation 2. Breast cancer stage 1 3. Pulmonary embolism on chronic anticoagulation with coumadin  PRIOR THERAPY: 1 ET: hydrea 500 mg BID to keep platelets below 400,000 2. Breast cancer observation 3. Coumadin managed by PCP  CURRENT THERAPY:Hydrea 500 mg po BID  INTERVAL HISTORY: Lori Page 76 y.o. female returns for follow up visit. Overall she is doing well. She denies any fevers, chills, nausea or vomiting, no bleeding or bruising. She has not any recent admissions to the hospital. No changes in her bowel or bladder habits.No breast masses or breast pain. Remainder of the review of systems is negative.  MEDICAL HISTORY:No past medical history on file.  ALLERGIES:   has no known allergies.  MEDICATIONS:  Current Outpatient Prescriptions  Medication Sig Dispense Refill  . amLODipine (NORVASC) 5 MG tablet Take 5 mg by mouth daily. 1/2 tab daily      . atorvastatin (LIPITOR) 20 MG tablet Take 20 mg by mouth daily. Pt takes 1/2 tablet daily      . calcium carbonate (OS-CAL) 600 MG TABS Take 600 mg by mouth daily.      . fluticasone (CUTIVATE) 0.05 % cream Apply topically 2 (two) times daily.      . hydroxyurea (HYDREA) 500 MG capsule Take 500 mg by mouth 2 (two) times daily. May take with food to minimize GI side effects.      Marland Kitchen levothyroxine (SYNTHROID, LEVOTHROID) 50 MCG tablet Take 50 mcg by mouth daily.      . metoprolol succinate (TOPROL-XL) 100 MG 24 hr tablet Take 100 mg by mouth daily. Take with or immediately following a meal.      . omeprazole (PRILOSEC) 40 MG capsule Take 40 mg by mouth 2 (two) times daily.       . valsartan-hydrochlorothiazide (DIOVAN-HCT) 320-25 MG per tablet Take 1 tablet by mouth daily. 1/2 tablet daily      . warfarin (COUMADIN) 5 MG tablet Take 5 mg by mouth daily. Pt takes 2.5mg  daily        SURGICAL HISTORY:  No past surgical history on file.  REVIEW OF SYSTEMS:  Constitutional: negative Eyes: negative Ears, nose, mouth, throat, and face: negative Respiratory: negative Cardiovascular: negative Gastrointestinal: negative Genitourinary:negative Integument/breast: negative Hematologic/lymphatic: negative, no enlarged lymph nodes Musculoskeletal:negative Neurological: negative   PHYSICAL EXAMINATION: General appearance: alert, cooperative and appears stated age Head: Normocephalic, without obvious abnormality, atraumatic Neck: no adenopathy, no carotid bruit, no JVD, supple, symmetrical, trachea midline and thyroid not enlarged, symmetric, no tenderness/mass/nodules Lymph nodes: Cervical, supraclavicular, and axillary nodes normal. Resp: clear to auscultation bilaterally and normal percussion bilaterally Back: symmetric, no curvature. ROM normal. No CVA tenderness. Cardio: regular rate and rhythm, S1, S2 normal, no murmur, click, rub or gallop GI: soft, non-tender; bowel sounds normal; no masses,  no organomegaly Genitalia: defer exam Extremities: extremities normal, atraumatic, no cyanosis or edema Neurologic: Alert and oriented X 3, normal strength and tone. Normal symmetric reflexes. Normal coordination and gait Breast Exam:no masses or nipple discharge ECOG PERFORMANCE STATUS: 0 - Asymptomatic  Blood pressure 152/88, pulse 79, temperature 98.1 F (36.7 C), temperature source Oral, height 5\' 4"  (1.626 m), weight 225 lb 3.2 oz (102.15 kg).  LABORATORY DATA: Lab Results  Component Value Date   WBC 4.0 03/09/2011   HGB 14.1 03/09/2011   HCT 41.6 03/09/2011   MCV 104.6* 03/09/2011   PLT  206 03/09/2011      Chemistry      Component Value Date/Time   NA 142 03/09/2011 0755   K 3.9 03/09/2011 0755   CL 107 03/09/2011 0755   CO2 24 03/09/2011 0755   BUN 11 03/09/2011 0755   CREATININE 0.93 03/09/2011 0755      Component Value Date/Time   CALCIUM 9.0 03/09/2011 0755   ALKPHOS 72 03/09/2011  0755   AST 20 03/09/2011 0755   ALT 17 03/09/2011 0755   BILITOT 0.4 03/09/2011 0755       RADIOGRAPHIC STUDIES:  No results found.  ASSESSMENT: 77 year old female with: 1. ET overall doing well on hydrea 2. Breast cancer 3. PE   PLAN:  1. Continue present dose of hydrea, with follow up q 6 months. Check labs q month 2. Observation, yearly mammograms 3. INR managed by PCP.    All questions were answered. The patient knows to call the clinic with any problems, questions or concerns. We can certainly see the patient much sooner if necessary.  I spent 20 minutes counseling the patient face to face. The total time spent in the appointment was 30 minutes.    Drue Second, MD Medical/Oncology [facility] [phone] (beeper) [phone] (Office)

## 2011-04-07 ENCOUNTER — Other Ambulatory Visit (HOSPITAL_COMMUNITY): Payer: Self-pay | Admitting: Internal Medicine

## 2011-04-07 DIAGNOSIS — Z1231 Encounter for screening mammogram for malignant neoplasm of breast: Secondary | ICD-10-CM

## 2011-04-13 ENCOUNTER — Other Ambulatory Visit (HOSPITAL_BASED_OUTPATIENT_CLINIC_OR_DEPARTMENT_OTHER): Payer: Medicare Other | Admitting: Lab

## 2011-04-13 ENCOUNTER — Other Ambulatory Visit: Payer: Self-pay | Admitting: *Deleted

## 2011-04-13 DIAGNOSIS — D473 Essential (hemorrhagic) thrombocythemia: Secondary | ICD-10-CM

## 2011-04-13 LAB — CBC WITH DIFFERENTIAL/PLATELET
BASO%: 0.3 % (ref 0.0–2.0)
EOS%: 0.8 % (ref 0.0–7.0)
HCT: 41.2 % (ref 34.8–46.6)
LYMPH%: 44.6 % (ref 14.0–49.7)
MCH: 33.7 pg (ref 25.1–34.0)
MCHC: 33.3 g/dL (ref 31.5–36.0)
MCV: 101.2 fL — ABNORMAL HIGH (ref 79.5–101.0)
NEUT%: 44.2 % (ref 38.4–76.8)
Platelets: 211 10*3/uL (ref 145–400)

## 2011-04-13 NOTE — Telephone Encounter (Signed)
MD reviewed pt's labs, notified pt to continue same dose of Hydrea 500mg  BID. Pt verbalized understanding.

## 2011-05-10 ENCOUNTER — Other Ambulatory Visit (HOSPITAL_COMMUNITY): Payer: Self-pay | Admitting: Internal Medicine

## 2011-05-10 ENCOUNTER — Ambulatory Visit (HOSPITAL_COMMUNITY)
Admission: RE | Admit: 2011-05-10 | Discharge: 2011-05-10 | Disposition: A | Discharge status: Home / Self Care | Payer: Medicare Other | Source: Ambulatory Visit | Attending: Internal Medicine | Admitting: Internal Medicine

## 2011-05-10 DIAGNOSIS — Z1231 Encounter for screening mammogram for malignant neoplasm of breast: Secondary | ICD-10-CM

## 2011-05-11 ENCOUNTER — Other Ambulatory Visit (HOSPITAL_BASED_OUTPATIENT_CLINIC_OR_DEPARTMENT_OTHER): Payer: Medicare Other | Admitting: Lab

## 2011-05-11 DIAGNOSIS — D473 Essential (hemorrhagic) thrombocythemia: Secondary | ICD-10-CM

## 2011-05-11 LAB — CBC WITH DIFFERENTIAL/PLATELET
BASO%: 0.7 % (ref 0.0–2.0)
EOS%: 1 % (ref 0.0–7.0)
MCH: 35.2 pg — ABNORMAL HIGH (ref 25.1–34.0)
MCHC: 33.2 g/dL (ref 31.5–36.0)
MONO%: 10.7 % (ref 0.0–14.0)
NEUT%: 44.5 % (ref 38.4–76.8)
RDW: 13.9 % (ref 11.2–14.5)
lymph#: 2 10*3/uL (ref 0.9–3.3)

## 2011-06-08 ENCOUNTER — Telehealth: Payer: Self-pay | Admitting: Oncology

## 2011-06-08 ENCOUNTER — Encounter: Payer: Self-pay | Admitting: Oncology

## 2011-06-08 ENCOUNTER — Ambulatory Visit (HOSPITAL_BASED_OUTPATIENT_CLINIC_OR_DEPARTMENT_OTHER): Payer: Medicare Other | Admitting: Oncology

## 2011-06-08 ENCOUNTER — Other Ambulatory Visit (HOSPITAL_BASED_OUTPATIENT_CLINIC_OR_DEPARTMENT_OTHER): Payer: Medicare Other | Admitting: Lab

## 2011-06-08 VITALS — BP 148/88 | HR 71 | Temp 98.4°F | Ht 64.0 in | Wt 224.3 lb

## 2011-06-08 DIAGNOSIS — D473 Essential (hemorrhagic) thrombocythemia: Secondary | ICD-10-CM

## 2011-06-08 DIAGNOSIS — I2699 Other pulmonary embolism without acute cor pulmonale: Secondary | ICD-10-CM

## 2011-06-08 DIAGNOSIS — Z853 Personal history of malignant neoplasm of breast: Secondary | ICD-10-CM

## 2011-06-08 DIAGNOSIS — Z7901 Long term (current) use of anticoagulants: Secondary | ICD-10-CM

## 2011-06-08 HISTORY — DX: Essential (hemorrhagic) thrombocythemia: D47.3

## 2011-06-08 LAB — CBC WITH DIFFERENTIAL/PLATELET
BASO%: 1 % (ref 0.0–2.0)
Basophils Absolute: 0 10*3/uL (ref 0.0–0.1)
EOS%: 0.9 % (ref 0.0–7.0)
HCT: 42.3 % (ref 34.8–46.6)
LYMPH%: 45.2 % (ref 14.0–49.7)
MCH: 35.2 pg — ABNORMAL HIGH (ref 25.1–34.0)
MCHC: 33.3 g/dL (ref 31.5–36.0)
MCV: 105.7 fL — ABNORMAL HIGH (ref 79.5–101.0)
MONO%: 10.6 % (ref 0.0–14.0)
NEUT%: 42.3 % (ref 38.4–76.8)
Platelets: 225 10*3/uL (ref 145–400)
lymph#: 1.9 10*3/uL (ref 0.9–3.3)

## 2011-06-08 LAB — COMPREHENSIVE METABOLIC PANEL
ALT: 17 U/L (ref 0–35)
AST: 21 U/L (ref 0–37)
Alkaline Phosphatase: 67 U/L (ref 39–117)
Calcium: 8.7 mg/dL (ref 8.4–10.5)
Chloride: 107 mEq/L (ref 96–112)
Creatinine, Ser: 0.95 mg/dL (ref 0.50–1.10)

## 2011-06-08 NOTE — Telephone Encounter (Signed)
gve the pt her June-feb 2014 appt calendar

## 2011-06-08 NOTE — Progress Notes (Signed)
OFFICE PROGRESS NOTE  CC Dr. Merri Brunette Dr. Adela Lank  DIAGNOSIS: 76 year old with 1. Essential thrombocytosis with Jak 2 mutation 2. Breast cancer stage 1 3. Pulmonary embolism on chronic anticoagulation with coumadin  PRIOR THERAPY: 1 ET: hydrea 500 mg BID to keep platelets below 400,000 2. Breast cancer observation 3. Coumadin managed by PCP  CURRENT THERAPY:Hydrea 500 mg po BID  INTERVAL HISTORY: Lori Page 76 y.o. female returns for follow up visit. Overall she is doing well. She denies any fevers, chills, nausea or vomiting, no bleeding or bruising. She has not any recent admissions to the hospital. No changes in her bowel or bladder habits.No breast masses or breast pain. Remainder of the review of systems is negative.  MEDICAL HISTORY: Past Medical History  Diagnosis Date  . Essential thrombocytosis   . Breast cancer   . HX: breast cancer 06/08/2011  . Essential thrombocytosis 06/08/2011    ALLERGIES:   has no known allergies.  MEDICATIONS:  Current Outpatient Prescriptions  Medication Sig Dispense Refill  . amLODipine (NORVASC) 5 MG tablet Take 5 mg by mouth daily. 1/2 tab daily      . atorvastatin (LIPITOR) 20 MG tablet Take 20 mg by mouth daily. Pt takes 1/2 tablet daily      . calcium carbonate (OS-CAL) 600 MG TABS Take 600 mg by mouth daily.      . hydroxyurea (HYDREA) 500 MG capsule Take 500 mg by mouth 2 (two) times daily. May take with food to minimize GI side effects.      Marland Kitchen levothyroxine (SYNTHROID, LEVOTHROID) 50 MCG tablet Take 75 mcg by mouth daily.       . metoprolol succinate (TOPROL-XL) 100 MG 24 hr tablet Take 100 mg by mouth daily. Take with or immediately following a meal.      . omeprazole (PRILOSEC) 40 MG capsule Take 40 mg by mouth 2 (two) times daily.       . valsartan-hydrochlorothiazide (DIOVAN-HCT) 320-25 MG per tablet Take 1 tablet by mouth daily. 1/2 tablet daily      . warfarin (COUMADIN) 5 MG tablet Take 5 mg by mouth daily.  Pt takes 2.5mg  daily      . fluticasone (CUTIVATE) 0.05 % cream Apply topically 2 (two) times daily.        SURGICAL HISTORY: No past surgical history on file.  REVIEW OF SYSTEMS:  Constitutional: negative Eyes: negative Ears, nose, mouth, throat, and face: negative Respiratory: negative Cardiovascular: negative Gastrointestinal: negative Genitourinary:negative Integument/breast: negative Hematologic/lymphatic: negative, no enlarged lymph nodes Musculoskeletal:negative Neurological: negative   PHYSICAL EXAMINATION: General appearance: alert, cooperative and appears stated age Head: Normocephalic, without obvious abnormality, atraumatic Neck: no adenopathy, no carotid bruit, no JVD, supple, symmetrical, trachea midline and thyroid not enlarged, symmetric, no tenderness/mass/nodules Lymph nodes: Cervical, supraclavicular, and axillary nodes normal. Resp: clear to auscultation bilaterally and normal percussion bilaterally Back: symmetric, no curvature. ROM normal. No CVA tenderness. Cardio: regular rate and rhythm, S1, S2 normal, no murmur, click, rub or gallop GI: soft, non-tender; bowel sounds normal; no masses,  no organomegaly Genitalia: defer exam Extremities: extremities normal, atraumatic, no cyanosis or edema Neurologic: Alert and oriented X 3, normal strength and tone. Normal symmetric reflexes. Normal coordination and gait Breast Exam:no masses or nipple discharge ECOG PERFORMANCE STATUS: 0 - Asymptomatic  Blood pressure 148/88, pulse 71, temperature 98.4 F (36.9 C), temperature source Oral, height 5\' 4"  (1.626 m), weight 224 lb 4.8 oz (101.742 kg).  LABORATORY DATA: Lab Results  Component  Value Date   WBC 4.1 06/08/2011   HGB 14.1 06/08/2011   HCT 42.3 06/08/2011   MCV 105.7* 06/08/2011   PLT 225 06/08/2011      Chemistry      Component Value Date/Time   NA 142 03/09/2011 0755   K 3.9 03/09/2011 0755   CL 107 03/09/2011 0755   CO2 24 03/09/2011 0755   BUN 11  03/09/2011 0755   CREATININE 0.93 03/09/2011 0755      Component Value Date/Time   CALCIUM 9.0 03/09/2011 0755   ALKPHOS 72 03/09/2011 0755   AST 20 03/09/2011 0755   ALT 17 03/09/2011 0755   BILITOT 0.4 03/09/2011 0755       RADIOGRAPHIC STUDIES:  No results found.  ASSESSMENT: 76 year old female with: 1. ET overall doing well on hydrea 2. Breast cancer 3. PE   PLAN:  1. Continue present dose of hydrea, with follow up q 6 months. Check labs q 2 month 2. Observation, yearly mammograms 3. INR managed by PCP.    All questions were answered. The patient knows to call the clinic with any problems, questions or concerns. We can certainly see the patient much sooner if necessary.  I spent 20 minutes counseling the patient face to face. The total time spent in the appointment was 30 minutes.    Drue Second, MD Medical/Oncology [facility] [phone] (beeper) [phone] (Office)

## 2011-06-08 NOTE — Patient Instructions (Signed)
1. You are doing well.  2. Continue hydrea at 500 mg twice a day.  3. Labs every 2 months beginning on August 08, 2011  4, I will see you back in 6 months

## 2011-08-04 IMAGING — US US SOFT TISSUE HEAD/NECK
1 series · 14 of 25 positions shown · non-contrast
Comparison: 10/02/2008

CLINICAL DATA: Thyroid nodule.

THYROID ULTRASOUND
TECHNIQUE: Ultrasound examination of the thyroid gland and adjacent
soft tissues was performed.

[Series 1: us soft tissue head/neck · 0.08mm/px · 14 of 60 slices shown]
[im 1/60]
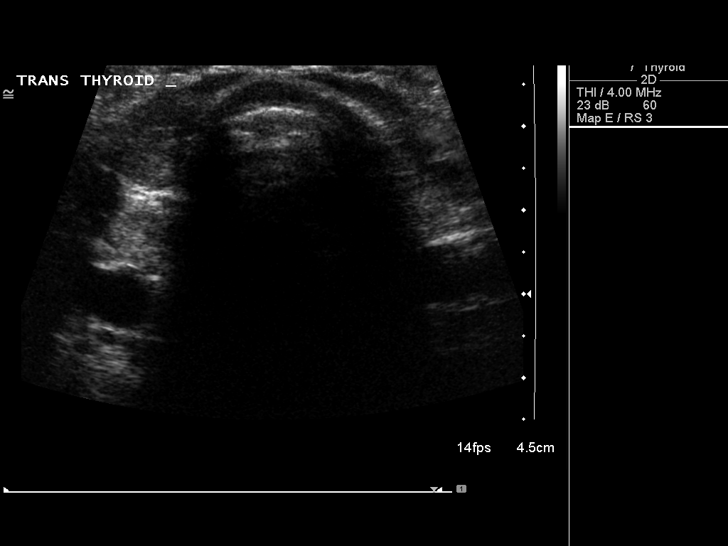
[im 5/60]
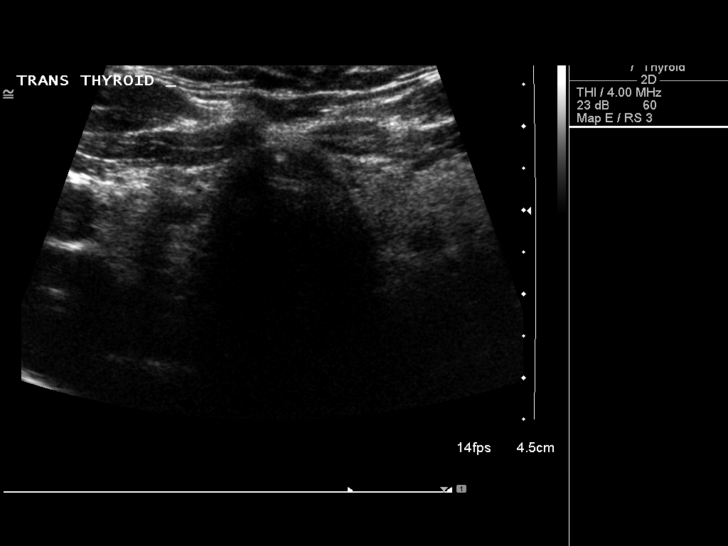
[im 10/60]
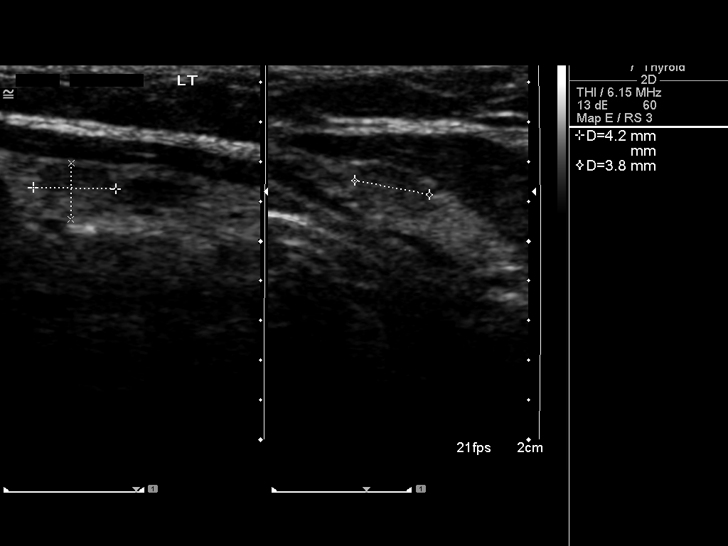
[im 15/60]
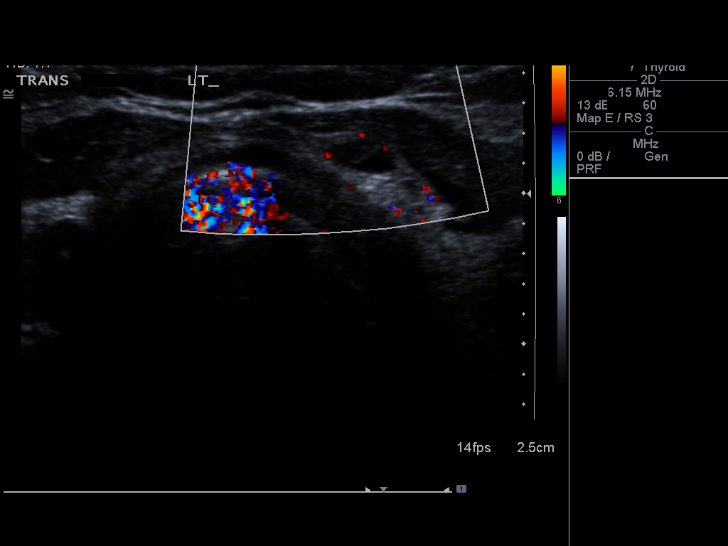
[im 20/60]
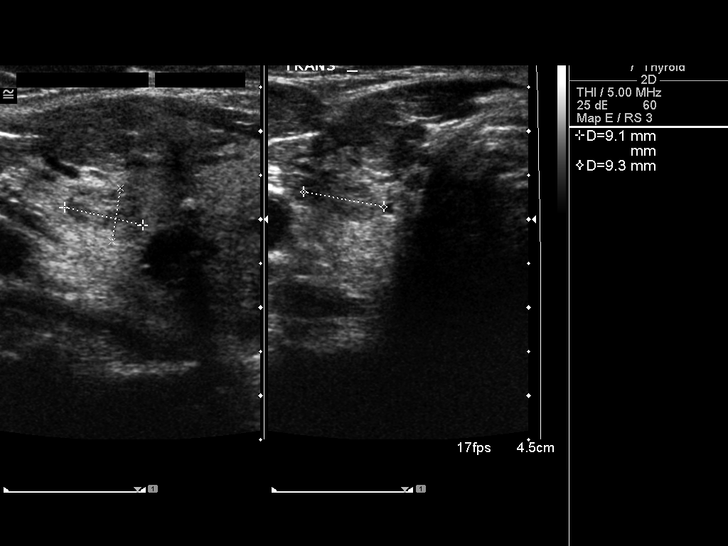
[im 23/60]
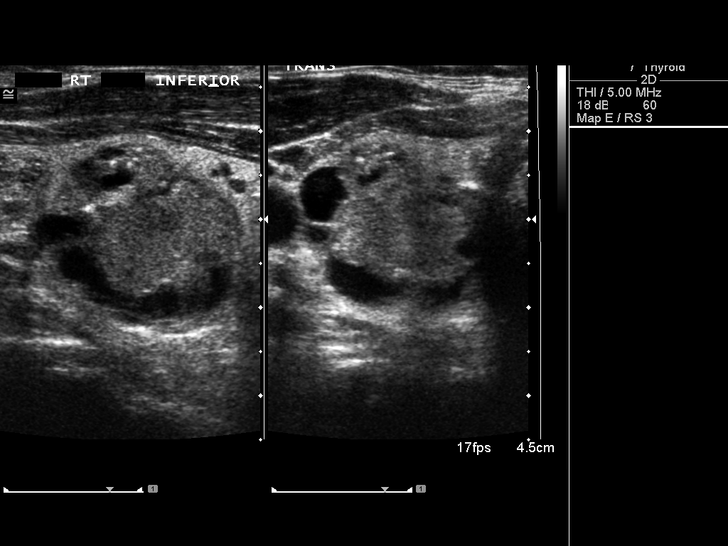
[im 28/60]
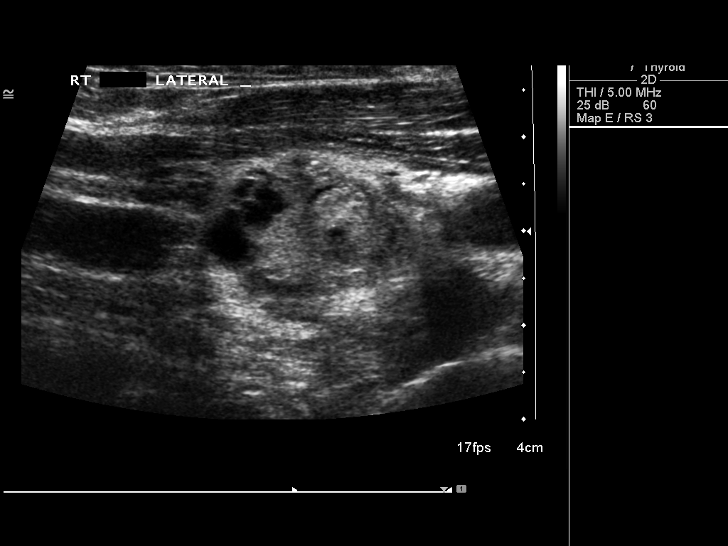
[im 32/60]
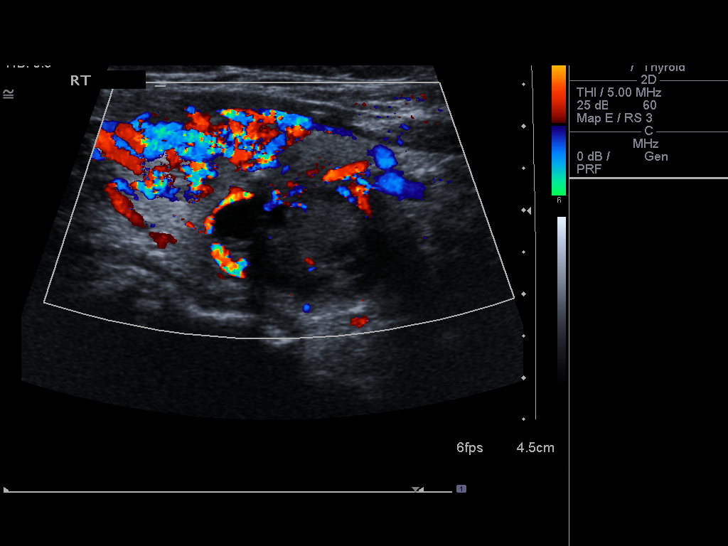
[im 37/60]
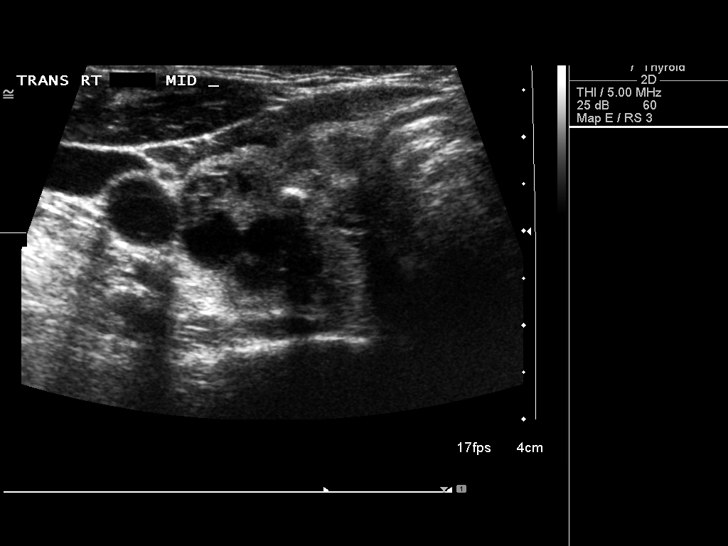
[im 40/60]
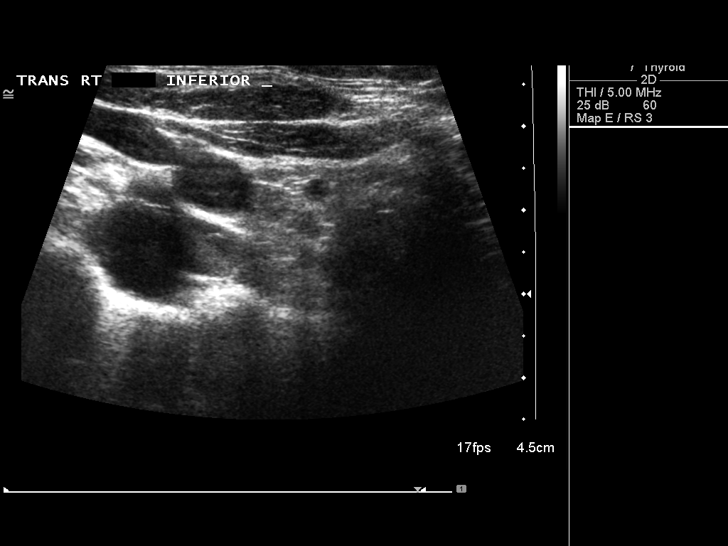
[im 45/60]
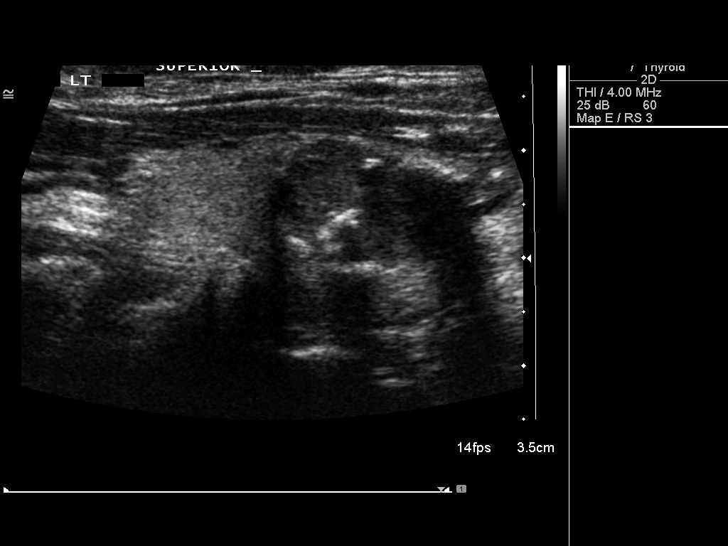
[im 50/60]
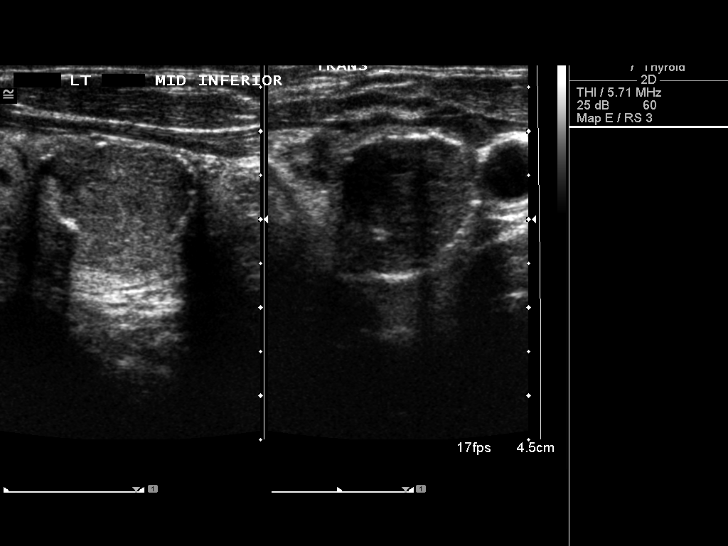
[im 55/60]
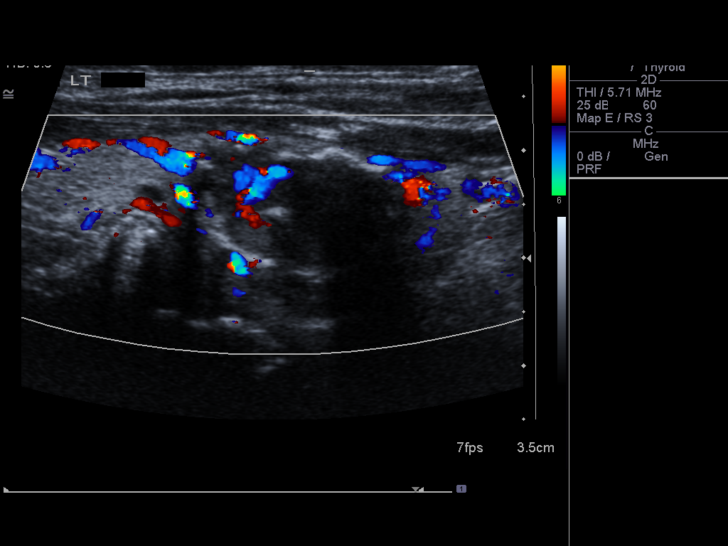
[im 60/60]
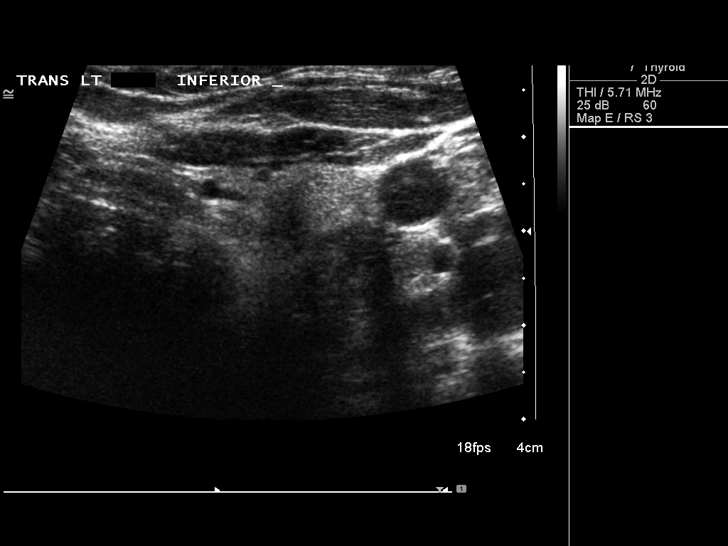

[14 of 25 positions shown; findings below may reference images not displayed]

FINDINGS: Right thyroid lobe:  Measures 4.7 x 2.5 x 2.0 cm (previously 4.4 x
2.4 x 2.1 cm) and is heterogeneous in echotexture.
Left thyroid lobe:  Measures 4.5 x 1.7 x 2.0 cm (previously 4.0 x
2.1 x 2.2 cm) and is heterogeneous in echotexture.
Isthmus:  Measures 1 mm.

Focal nodules:  There are solid nodules bilaterally, measuring up
to 2.5 x 2.2 x 2.3 cm in the lower pole right thyroid (previously
1.6 x 2.5 x 3.0 cm) and 1.9 x 1.6 x 1.6 cm in the lower pole left
thyroid (previously 1.8 x 1.7 x 2.0 cm).

Lymphadenopathy:  None visualized.
IMPRESSION: Bilateral thyroid nodules, the largest of which are likely stable
from 10/02/2008.

## 2011-08-08 ENCOUNTER — Telehealth: Payer: Self-pay | Admitting: *Deleted

## 2011-08-08 ENCOUNTER — Other Ambulatory Visit (HOSPITAL_BASED_OUTPATIENT_CLINIC_OR_DEPARTMENT_OTHER): Payer: Medicare Other | Admitting: Lab

## 2011-08-08 DIAGNOSIS — D473 Essential (hemorrhagic) thrombocythemia: Secondary | ICD-10-CM

## 2011-08-08 LAB — CBC WITH DIFFERENTIAL/PLATELET
BASO%: 0.7 % (ref 0.0–2.0)
HCT: 41.9 % (ref 34.8–46.6)
LYMPH%: 45.5 % (ref 14.0–49.7)
MCH: 34.8 pg — ABNORMAL HIGH (ref 25.1–34.0)
MCHC: 33.4 g/dL (ref 31.5–36.0)
MCV: 104.2 fL — ABNORMAL HIGH (ref 79.5–101.0)
MONO#: 0.5 10*3/uL (ref 0.1–0.9)
MONO%: 11.7 % (ref 0.0–14.0)
NEUT%: 41.1 % (ref 38.4–76.8)
Platelets: 218 10*3/uL (ref 145–400)
RBC: 4.02 10*6/uL (ref 3.70–5.45)
WBC: 4 10*3/uL (ref 3.9–10.3)

## 2011-08-08 NOTE — Telephone Encounter (Signed)
Per MD, notified pt Labs look good. Keep same dose of Hydrea. Requested pt to call back to confirm msg received.

## 2011-08-08 NOTE — Telephone Encounter (Signed)
Message copied by Cooper Render on Tue Aug 08, 2011  3:59 PM ------      Message from: Victorino December      Created: Tue Aug 08, 2011  9:07 AM       Call patient:lab look good, keep same dose of hydrea

## 2011-09-17 ENCOUNTER — Other Ambulatory Visit: Payer: Self-pay | Admitting: Oncology

## 2011-10-03 ENCOUNTER — Other Ambulatory Visit (HOSPITAL_BASED_OUTPATIENT_CLINIC_OR_DEPARTMENT_OTHER): Payer: Medicare Other | Admitting: Lab

## 2011-10-03 DIAGNOSIS — D473 Essential (hemorrhagic) thrombocythemia: Secondary | ICD-10-CM

## 2011-10-03 LAB — CBC WITH DIFFERENTIAL/PLATELET
BASO%: 0.7 % (ref 0.0–2.0)
EOS%: 0.8 % (ref 0.0–7.0)
HCT: 41.8 % (ref 34.8–46.6)
LYMPH%: 43 % (ref 14.0–49.7)
MCH: 34.7 pg — ABNORMAL HIGH (ref 25.1–34.0)
MCHC: 33.3 g/dL (ref 31.5–36.0)
NEUT%: 43.3 % (ref 38.4–76.8)
RBC: 4 10*6/uL (ref 3.70–5.45)
WBC: 3.9 10*3/uL (ref 3.9–10.3)
lymph#: 1.7 10*3/uL (ref 0.9–3.3)

## 2011-10-05 ENCOUNTER — Telehealth: Payer: Self-pay | Admitting: *Deleted

## 2011-10-05 NOTE — Telephone Encounter (Signed)
Per MD,notified pt to continue same dose of hydrea. Unable to reach pt, lmovm. Requested call back.

## 2011-10-05 NOTE — Telephone Encounter (Signed)
Message copied by Cooper Render on Thu Oct 05, 2011  4:01 PM ------      Message from: Victorino December      Created: Thu Oct 05, 2011  9:20 AM       Call patient: keep same dose of hydrea

## 2011-11-28 ENCOUNTER — Other Ambulatory Visit (HOSPITAL_BASED_OUTPATIENT_CLINIC_OR_DEPARTMENT_OTHER): Payer: Medicare Other | Admitting: Lab

## 2011-11-28 DIAGNOSIS — D473 Essential (hemorrhagic) thrombocythemia: Secondary | ICD-10-CM

## 2011-11-28 LAB — CBC WITH DIFFERENTIAL/PLATELET
BASO%: 0.6 % (ref 0.0–2.0)
EOS%: 0.9 % (ref 0.0–7.0)
HGB: 14.5 g/dL (ref 11.6–15.9)
MCH: 35.4 pg — ABNORMAL HIGH (ref 25.1–34.0)
MCHC: 33.5 g/dL (ref 31.5–36.0)
MONO#: 0.5 10*3/uL (ref 0.1–0.9)
RDW: 14.7 % — ABNORMAL HIGH (ref 11.2–14.5)
WBC: 3.9 10*3/uL (ref 3.9–10.3)
lymph#: 1.8 10*3/uL (ref 0.9–3.3)

## 2011-12-05 ENCOUNTER — Telehealth: Payer: Self-pay | Admitting: *Deleted

## 2011-12-05 NOTE — Telephone Encounter (Signed)
Per MD, notified pt to continue same dose Hydrea and recheck labs in 1 month. Pt verbalized understanding

## 2011-12-05 NOTE — Telephone Encounter (Signed)
Message copied by Cooper Render on Tue Dec 05, 2011 10:37 AM ------      Message from: Victorino December      Created: Tue Dec 05, 2011 12:10 AM       Call patient: keep same dose of hydrea, recheck in 1 month

## 2011-12-25 ENCOUNTER — Ambulatory Visit (HOSPITAL_BASED_OUTPATIENT_CLINIC_OR_DEPARTMENT_OTHER): Payer: Medicare Other | Admitting: Oncology

## 2011-12-25 ENCOUNTER — Encounter: Payer: Self-pay | Admitting: Oncology

## 2011-12-25 ENCOUNTER — Other Ambulatory Visit (HOSPITAL_BASED_OUTPATIENT_CLINIC_OR_DEPARTMENT_OTHER): Payer: Medicare Other | Admitting: Lab

## 2011-12-25 ENCOUNTER — Telehealth: Payer: Self-pay | Admitting: *Deleted

## 2011-12-25 VITALS — BP 134/85 | HR 71 | Temp 98.2°F | Resp 20 | Ht 64.0 in | Wt 228.0 lb

## 2011-12-25 DIAGNOSIS — Z853 Personal history of malignant neoplasm of breast: Secondary | ICD-10-CM

## 2011-12-25 DIAGNOSIS — Z7901 Long term (current) use of anticoagulants: Secondary | ICD-10-CM

## 2011-12-25 DIAGNOSIS — D473 Essential (hemorrhagic) thrombocythemia: Secondary | ICD-10-CM

## 2011-12-25 DIAGNOSIS — I2699 Other pulmonary embolism without acute cor pulmonale: Secondary | ICD-10-CM

## 2011-12-25 LAB — CBC WITH DIFFERENTIAL/PLATELET
Basophils Absolute: 0 10*3/uL (ref 0.0–0.1)
Eosinophils Absolute: 0 10*3/uL (ref 0.0–0.5)
HGB: 14 g/dL (ref 11.6–15.9)
MONO#: 0.4 10*3/uL (ref 0.1–0.9)
NEUT#: 1.6 10*3/uL (ref 1.5–6.5)
RDW: 14.5 % (ref 11.2–14.5)
lymph#: 1.9 10*3/uL (ref 0.9–3.3)

## 2011-12-25 LAB — COMPREHENSIVE METABOLIC PANEL (CC13)
AST: 18 U/L (ref 5–34)
Albumin: 3.3 g/dL — ABNORMAL LOW (ref 3.5–5.0)
BUN: 14 mg/dL (ref 7.0–26.0)
CO2: 27 mEq/L (ref 22–29)
Calcium: 9.1 mg/dL (ref 8.4–10.4)
Chloride: 109 mEq/L — ABNORMAL HIGH (ref 98–107)
Glucose: 110 mg/dl — ABNORMAL HIGH (ref 70–99)
Potassium: 3.6 mEq/L (ref 3.5–5.1)

## 2011-12-25 NOTE — Telephone Encounter (Signed)
Gave patient appointments every two months for labs only  Gave patient appointment for 06-24-2012 lab and md

## 2011-12-25 NOTE — Progress Notes (Signed)
OFFICE PROGRESS NOTE  CC Dr. Merri Brunette Dr. Adela Lank  DIAGNOSIS: 76 year old with 1. Essential thrombocytosis with Jak 2 mutation 2. Breast cancer stage 1 3. Pulmonary embolism on chronic anticoagulation with coumadin  PRIOR THERAPY: 1 ET: hydrea 500 mg BID to keep platelets below 400,000 2. Breast cancer observation 3. Coumadin managed by PCP  CURRENT THERAPY:Hydrea 500 mg po BID  INTERVAL HISTORY: Jenascia Brune 76 y.o. female returns for follow up visit. Overall she is doing well. She denies any fevers, chills, nausea or vomiting, no bleeding or bruising. She has not any recent admissions to the hospital. No changes in her bowel or bladder habits.No breast masses or breast pain. Remainder of the review of systems is negative.  MEDICAL HISTORY: Past Medical History  Diagnosis Date  . Essential thrombocytosis   . Breast cancer   . HX: breast cancer 06/08/2011  . Essential thrombocytosis 06/08/2011    ALLERGIES:   has no known allergies.  MEDICATIONS:  Current Outpatient Prescriptions  Medication Sig Dispense Refill  . amLODipine (NORVASC) 5 MG tablet Take 5 mg by mouth daily. 1/2 tab daily      . atorvastatin (LIPITOR) 20 MG tablet Take 20 mg by mouth daily. Pt takes 1/2 tablet daily      . calcium carbonate (OS-CAL) 600 MG TABS Take 600 mg by mouth daily.      . fluticasone (CUTIVATE) 0.05 % cream Apply topically 2 (two) times daily.      . hydroxyurea (HYDREA) 500 MG capsule TAKE 1 CAPSULE BY MOUTH TWICE DAILY  180 capsule  4  . levothyroxine (SYNTHROID, LEVOTHROID) 50 MCG tablet Take 75 mcg by mouth daily.       . metoprolol succinate (TOPROL-XL) 100 MG 24 hr tablet Take 100 mg by mouth daily. Take with or immediately following a meal.      . omeprazole (PRILOSEC) 40 MG capsule Take 40 mg by mouth 2 (two) times daily.       . valsartan-hydrochlorothiazide (DIOVAN-HCT) 320-25 MG per tablet Take 1 tablet by mouth daily. 1/2 tablet daily      . warfarin (COUMADIN)  5 MG tablet Take 5 mg by mouth daily. Pt takes 2.5mg  daily        SURGICAL HISTORY: No past surgical history on file.  REVIEW OF SYSTEMS:  Constitutional: negative Eyes: negative Ears, nose, mouth, throat, and face: negative Respiratory: negative Cardiovascular: negative Gastrointestinal: negative Genitourinary:negative Integument/breast: negative Hematologic/lymphatic: negative, no enlarged lymph nodes Musculoskeletal:negative Neurological: negative   PHYSICAL EXAMINATION: General appearance: alert, cooperative and appears stated age Head: Normocephalic, without obvious abnormality, atraumatic Neck: no adenopathy, no carotid bruit, no JVD, supple, symmetrical, trachea midline and thyroid not enlarged, symmetric, no tenderness/mass/nodules Lymph nodes: Cervical, supraclavicular, and axillary nodes normal. Resp: clear to auscultation bilaterally and normal percussion bilaterally Back: symmetric, no curvature. ROM normal. No CVA tenderness. Cardio: regular rate and rhythm, S1, S2 normal, no murmur, click, rub or gallop GI: soft, non-tender; bowel sounds normal; no masses,  no organomegaly Genitalia: defer exam Extremities: extremities normal, atraumatic, no cyanosis or edema Neurologic: Alert and oriented X 3, normal strength and tone. Normal symmetric reflexes. Normal coordination and gait Breast Exam:no masses or nipple discharge ECOG PERFORMANCE STATUS: 0 - Asymptomatic  Blood pressure 134/85, pulse 71, temperature 98.2 F (36.8 C), temperature source Oral, resp. rate 20, height 5\' 4"  (1.626 m), weight 228 lb (103.42 kg).  LABORATORY DATA: Lab Results  Component Value Date   WBC 4.0 12/25/2011  HGB 14.0 12/25/2011   HCT 41.6 12/25/2011   MCV 103.0* 12/25/2011   PLT 216 12/25/2011      Chemistry      Component Value Date/Time   NA 141 06/08/2011 0801   K 3.9 06/08/2011 0801   CL 107 06/08/2011 0801   CO2 26 06/08/2011 0801   BUN 15 06/08/2011 0801   CREATININE 0.95  06/08/2011 0801      Component Value Date/Time   CALCIUM 8.7 06/08/2011 0801   ALKPHOS 67 06/08/2011 0801   AST 21 06/08/2011 0801   ALT 17 06/08/2011 0801   BILITOT 0.4 06/08/2011 0801       RADIOGRAPHIC STUDIES:  No results found.  ASSESSMENT: 76 year old female with: 1. ET overall doing well on hydrea 2. Breast cancer 3. PE   PLAN:  1. Continue present dose of hydrea, with follow up q 6 months. Check labs q 2 month 2. Observation, yearly mammograms 3. INR managed by PCP.   All questions were answered. The patient knows to call the clinic with any problems, questions or concerns. We can certainly see the patient much sooner if necessary.  I spent 20 minutes counseling the patient face to face. The total time spent in the appointment was 30 minutes.    Drue Second, MD Medical/Oncology [facility] [phone] (beeper) [phone] (Office)

## 2011-12-25 NOTE — Patient Instructions (Addendum)
Continue hydrea 500 mg twice a day  We will get blood counts every other month  Follow up in office in 6 months

## 2012-01-30 ENCOUNTER — Other Ambulatory Visit: Payer: Medicare Other | Admitting: Lab

## 2012-02-23 ENCOUNTER — Other Ambulatory Visit (HOSPITAL_BASED_OUTPATIENT_CLINIC_OR_DEPARTMENT_OTHER): Payer: Medicare Other | Admitting: Lab

## 2012-02-23 DIAGNOSIS — D473 Essential (hemorrhagic) thrombocythemia: Secondary | ICD-10-CM

## 2012-02-23 LAB — CBC WITH DIFFERENTIAL/PLATELET
BASO%: 0.6 % (ref 0.0–2.0)
LYMPH%: 42.3 % (ref 14.0–49.7)
MCHC: 34.6 g/dL (ref 31.5–36.0)
MONO#: 0.5 10*3/uL (ref 0.1–0.9)
RBC: 3.95 10*6/uL (ref 3.70–5.45)
RDW: 13.8 % (ref 11.2–14.5)
WBC: 4.5 10*3/uL (ref 3.9–10.3)
lymph#: 1.9 10*3/uL (ref 0.9–3.3)

## 2012-03-07 ENCOUNTER — Other Ambulatory Visit: Payer: Self-pay | Admitting: Endocrinology

## 2012-03-07 DIAGNOSIS — E041 Nontoxic single thyroid nodule: Secondary | ICD-10-CM

## 2012-04-02 ENCOUNTER — Other Ambulatory Visit: Payer: Medicare Other | Admitting: Lab

## 2012-04-11 ENCOUNTER — Other Ambulatory Visit (HOSPITAL_COMMUNITY): Payer: Self-pay | Admitting: Internal Medicine

## 2012-04-11 DIAGNOSIS — Z1231 Encounter for screening mammogram for malignant neoplasm of breast: Secondary | ICD-10-CM

## 2012-04-23 ENCOUNTER — Other Ambulatory Visit (HOSPITAL_BASED_OUTPATIENT_CLINIC_OR_DEPARTMENT_OTHER): Payer: Medicare Other | Admitting: Lab

## 2012-04-23 DIAGNOSIS — D473 Essential (hemorrhagic) thrombocythemia: Secondary | ICD-10-CM

## 2012-04-23 LAB — CBC WITH DIFFERENTIAL/PLATELET
BASO%: 0.8 % (ref 0.0–2.0)
HCT: 42 % (ref 34.8–46.6)
HGB: 14.2 g/dL (ref 11.6–15.9)
MCHC: 33.7 g/dL (ref 31.5–36.0)
MONO#: 0.5 10*3/uL (ref 0.1–0.9)
NEUT#: 2 10*3/uL (ref 1.5–6.5)
NEUT%: 44.9 % (ref 38.4–76.8)
WBC: 4.5 10*3/uL (ref 3.9–10.3)
lymph#: 1.9 10*3/uL (ref 0.9–3.3)

## 2012-05-13 ENCOUNTER — Other Ambulatory Visit (HOSPITAL_COMMUNITY): Payer: Self-pay | Admitting: Internal Medicine

## 2012-05-13 ENCOUNTER — Ambulatory Visit (HOSPITAL_COMMUNITY)
Admission: RE | Admit: 2012-05-13 | Discharge: 2012-05-13 | Disposition: A | Discharge status: Home / Self Care | Payer: Medicare Other | Source: Ambulatory Visit | Attending: Internal Medicine | Admitting: Internal Medicine

## 2012-05-13 DIAGNOSIS — Z1231 Encounter for screening mammogram for malignant neoplasm of breast: Secondary | ICD-10-CM

## 2012-06-24 ENCOUNTER — Encounter: Payer: Self-pay | Admitting: Oncology

## 2012-06-24 ENCOUNTER — Telehealth: Payer: Self-pay | Admitting: Oncology

## 2012-06-24 ENCOUNTER — Other Ambulatory Visit (HOSPITAL_BASED_OUTPATIENT_CLINIC_OR_DEPARTMENT_OTHER): Payer: Medicare Other | Admitting: Lab

## 2012-06-24 ENCOUNTER — Ambulatory Visit (HOSPITAL_BASED_OUTPATIENT_CLINIC_OR_DEPARTMENT_OTHER): Payer: Medicare Other | Admitting: Oncology

## 2012-06-24 VITALS — BP 142/84 | HR 72 | Temp 98.1°F | Resp 20 | Ht 64.0 in | Wt 231.3 lb

## 2012-06-24 DIAGNOSIS — I2699 Other pulmonary embolism without acute cor pulmonale: Secondary | ICD-10-CM

## 2012-06-24 DIAGNOSIS — Z853 Personal history of malignant neoplasm of breast: Secondary | ICD-10-CM

## 2012-06-24 DIAGNOSIS — D473 Essential (hemorrhagic) thrombocythemia: Secondary | ICD-10-CM

## 2012-06-24 LAB — CBC WITH DIFFERENTIAL/PLATELET
Basophils Absolute: 0 10*3/uL (ref 0.0–0.1)
HCT: 42.7 % (ref 34.8–46.6)
HGB: 14 g/dL (ref 11.6–15.9)
MCH: 34.3 pg — ABNORMAL HIGH (ref 25.1–34.0)
MONO#: 0.5 10*3/uL (ref 0.1–0.9)
NEUT%: 45.7 % (ref 38.4–76.8)
lymph#: 1.9 10*3/uL (ref 0.9–3.3)

## 2012-06-24 NOTE — Progress Notes (Signed)
OFFICE PROGRESS NOTE  CC Dr. Merri Brunette Dr. Adela Lank  DIAGNOSIS: 77 year old with 1. Essential thrombocytosis with Jak 2 mutation 2. Breast cancer stage 1 3. Pulmonary embolism on chronic anticoagulation with coumadin  PRIOR THERAPY: 1 ET: hydrea 500 mg BID to keep platelets below 400,000 2. Breast cancer observation 3. Coumadin managed by PCP  CURRENT THERAPY:Hydrea 500 mg po BID  INTERVAL HISTORY: Lori Page 77 y.o. female returns for follow up visit. Overall she is doing well. She denies any fevers, chills, nausea or vomiting, no bleeding or bruising. She has not any recent admissions to the hospital. No changes in her bowel or bladder habits.No breast masses or breast pain. Remainder of the review of systems is negative.  MEDICAL HISTORY: Past Medical History  Diagnosis Date  . Essential thrombocytosis   . Breast cancer   . HX: breast cancer 06/08/2011  . Essential thrombocytosis 06/08/2011    ALLERGIES:  has No Known Allergies.  MEDICATIONS:  Current Outpatient Prescriptions  Medication Sig Dispense Refill  . amLODipine (NORVASC) 5 MG tablet Take 5 mg by mouth daily.       . calcium carbonate (OS-CAL) 600 MG TABS Take 600 mg by mouth daily.      Marland Kitchen gabapentin (NEURONTIN) 100 MG capsule Take 100 mg by mouth as needed.      . hydroxyurea (HYDREA) 500 MG capsule TAKE 1 CAPSULE BY MOUTH TWICE DAILY  180 capsule  4  . levothyroxine (SYNTHROID, LEVOTHROID) 50 MCG tablet Take 50 mcg by mouth daily.       . metoprolol succinate (TOPROL-XL) 100 MG 24 hr tablet Take 100 mg by mouth daily. Take with or immediately following a meal.      . pravastatin (PRAVACHOL) 40 MG tablet Take 40 mg by mouth daily.      . valsartan-hydrochlorothiazide (DIOVAN-HCT) 320-25 MG per tablet Take 1 tablet by mouth daily. 1/2 tablet daily      . warfarin (COUMADIN) 5 MG tablet Take 5 mg by mouth daily. Pt takes 2.5mg  daily       No current facility-administered medications for this visit.     SURGICAL HISTORY: No past surgical history on file.  REVIEW OF SYSTEMS:  Constitutional: negative Eyes: negative Ears, nose, mouth, throat, and face: negative Respiratory: negative Cardiovascular: negative Gastrointestinal: negative Genitourinary:negative Integument/breast: negative Hematologic/lymphatic: negative, no enlarged lymph nodes Musculoskeletal:negative Neurological: negative   PHYSICAL EXAMINATION: General appearance: alert, cooperative and appears stated age Head: Normocephalic, without obvious abnormality, atraumatic Neck: no adenopathy, no carotid bruit, no JVD, supple, symmetrical, trachea midline and thyroid not enlarged, symmetric, no tenderness/mass/nodules Lymph nodes: Cervical, supraclavicular, and axillary nodes normal. Resp: clear to auscultation bilaterally and normal percussion bilaterally Back: symmetric, no curvature. ROM normal. No CVA tenderness. Cardio: regular rate and rhythm, S1, S2 normal, no murmur, click, rub or gallop GI: soft, non-tender; bowel sounds normal; no masses,  no organomegaly Genitalia: defer exam Extremities: extremities normal, atraumatic, no cyanosis or edema Neurologic: Alert and oriented X 3, normal strength and tone. Normal symmetric reflexes. Normal coordination and gait Breast Exam:no masses or nipple discharge ECOG PERFORMANCE STATUS: 0 - Asymptomatic  Blood pressure 142/84, pulse 72, temperature 98.1 F (36.7 C), temperature source Oral, resp. rate 20, height 5\' 4"  (1.626 m), weight 231 lb 4.8 oz (104.917 kg).  LABORATORY DATA: Lab Results  Component Value Date   WBC 4.6 06/24/2012   HGB 14.0 06/24/2012   HCT 42.7 06/24/2012   MCV 104.7* 06/24/2012   PLT 211  06/24/2012      Chemistry      Component Value Date/Time   NA 141 12/25/2011 0836   NA 141 06/08/2011 0801   K 3.6 12/25/2011 0836   K 3.9 06/08/2011 0801   CL 109* 12/25/2011 0836   CL 107 06/08/2011 0801   CO2 27 12/25/2011 0836   CO2 26 06/08/2011 0801    BUN 14.0 12/25/2011 0836   BUN 15 06/08/2011 0801   CREATININE 1.0 12/25/2011 0836   CREATININE 0.95 06/08/2011 0801      Component Value Date/Time   CALCIUM 9.1 12/25/2011 0836   CALCIUM 8.7 06/08/2011 0801   ALKPHOS 73 12/25/2011 0836   ALKPHOS 67 06/08/2011 0801   AST 18 12/25/2011 0836   AST 21 06/08/2011 0801   ALT 16 12/25/2011 0836   ALT 17 06/08/2011 0801   BILITOT 0.51 12/25/2011 0836   BILITOT 0.4 06/08/2011 0801       RADIOGRAPHIC STUDIES:  No results found.  ASSESSMENT: 77 year old female with: 1. ET overall doing well on hydrea 2. Breast cancer 3. PE   PLAN:  1. Continue 500 mg twice a day dose of hydrea, we will check cbc every 3 months  2. I will see you back in 1 year for follow up   All questions were answered. The patient knows to call the clinic with any problems, questions or concerns. We can certainly see the patient much sooner if necessary.  I spent 25 minutes counseling the patient face to face. The total time spent in the appointment was 30 minutes.    Drue Second, MD Medical/Oncology [facility] [phone] (beeper) [phone] (Office)

## 2012-06-24 NOTE — Patient Instructions (Addendum)
Doing well, platelets are where we want them to be  I will continue to check you cbc every 3 months  I will see you back in clinic in 1 year

## 2012-08-19 ENCOUNTER — Ambulatory Visit
Admission: RE | Admit: 2012-08-19 | Discharge: 2012-08-19 | Disposition: A | Discharge status: Home / Self Care | Payer: Medicare Other | Source: Ambulatory Visit | Attending: Endocrinology | Admitting: Endocrinology

## 2012-08-19 DIAGNOSIS — E041 Nontoxic single thyroid nodule: Secondary | ICD-10-CM

## 2012-08-23 ENCOUNTER — Other Ambulatory Visit (HOSPITAL_BASED_OUTPATIENT_CLINIC_OR_DEPARTMENT_OTHER): Payer: Medicare Other | Admitting: Lab

## 2012-08-23 DIAGNOSIS — D473 Essential (hemorrhagic) thrombocythemia: Secondary | ICD-10-CM

## 2012-08-23 LAB — COMPREHENSIVE METABOLIC PANEL (CC13)
ALT: 16 U/L (ref 0–55)
Alkaline Phosphatase: 64 U/L (ref 40–150)
Glucose: 108 mg/dl (ref 70–140)
Sodium: 141 mEq/L (ref 136–145)
Total Bilirubin: 0.47 mg/dL (ref 0.20–1.20)
Total Protein: 6.8 g/dL (ref 6.4–8.3)

## 2012-08-23 LAB — CBC WITH DIFFERENTIAL/PLATELET
BASO%: 0.5 % (ref 0.0–2.0)
LYMPH%: 40 % (ref 14.0–49.7)
MCHC: 32.9 g/dL (ref 31.5–36.0)
MCV: 104.2 fL — ABNORMAL HIGH (ref 79.5–101.0)
MONO%: 12.3 % (ref 0.0–14.0)
Platelets: 203 10*3/uL (ref 145–400)
RBC: 4.09 10*6/uL (ref 3.70–5.45)

## 2012-09-18 ENCOUNTER — Other Ambulatory Visit: Payer: Self-pay

## 2012-10-22 ENCOUNTER — Other Ambulatory Visit: Payer: Medicare Other | Admitting: Lab

## 2012-11-22 ENCOUNTER — Other Ambulatory Visit (HOSPITAL_BASED_OUTPATIENT_CLINIC_OR_DEPARTMENT_OTHER): Payer: BC Managed Care – PPO | Admitting: Lab

## 2012-11-22 DIAGNOSIS — D473 Essential (hemorrhagic) thrombocythemia: Secondary | ICD-10-CM

## 2012-11-22 LAB — CBC WITH DIFFERENTIAL/PLATELET
BASO%: 0.9 % (ref 0.0–2.0)
EOS%: 1 % (ref 0.0–7.0)
Eosinophils Absolute: 0 10*3/uL (ref 0.0–0.5)
MCV: 104 fL — ABNORMAL HIGH (ref 79.5–101.0)
NEUT#: 1.7 10*3/uL (ref 1.5–6.5)
NEUT%: 41.8 % (ref 38.4–76.8)
Platelets: 199 10*3/uL (ref 145–400)
RBC: 4.02 10*6/uL (ref 3.70–5.45)

## 2012-12-13 ENCOUNTER — Other Ambulatory Visit: Payer: Self-pay | Admitting: *Deleted

## 2012-12-13 DIAGNOSIS — D473 Essential (hemorrhagic) thrombocythemia: Secondary | ICD-10-CM

## 2012-12-13 MED ORDER — HYDROXYUREA 500 MG PO CAPS: ORAL_CAPSULE | ORAL | Status: DC

## 2012-12-19 ENCOUNTER — Other Ambulatory Visit: Payer: Self-pay

## 2012-12-23 ENCOUNTER — Other Ambulatory Visit: Payer: Medicare Other | Admitting: Lab

## 2013-02-21 ENCOUNTER — Other Ambulatory Visit (HOSPITAL_BASED_OUTPATIENT_CLINIC_OR_DEPARTMENT_OTHER): Payer: BC Managed Care – PPO

## 2013-02-21 DIAGNOSIS — D473 Essential (hemorrhagic) thrombocythemia: Secondary | ICD-10-CM

## 2013-02-21 LAB — CBC WITH DIFFERENTIAL/PLATELET
BASO%: 0.6 % (ref 0.0–2.0)
BASOS ABS: 0 10*3/uL (ref 0.0–0.1)
EOS ABS: 0 10*3/uL (ref 0.0–0.5)
EOS%: 1 % (ref 0.0–7.0)
HEMATOCRIT: 43.5 % (ref 34.8–46.6)
HEMOGLOBIN: 14.4 g/dL (ref 11.6–15.9)
LYMPH%: 36.8 % (ref 14.0–49.7)
MCH: 35 pg — AB (ref 25.1–34.0)
MCHC: 33.1 g/dL (ref 31.5–36.0)
MCV: 105.7 fL — ABNORMAL HIGH (ref 79.5–101.0)
MONO#: 0.6 10*3/uL (ref 0.1–0.9)
MONO%: 14.3 % — AB (ref 0.0–14.0)
NEUT#: 2.1 10*3/uL (ref 1.5–6.5)
NEUT%: 47.3 % (ref 38.4–76.8)
PLATELETS: 207 10*3/uL (ref 145–400)
RBC: 4.12 10*6/uL (ref 3.70–5.45)
RDW: 14.3 % (ref 11.2–14.5)
WBC: 4.4 10*3/uL (ref 3.9–10.3)
lymph#: 1.6 10*3/uL (ref 0.9–3.3)

## 2013-04-17 ENCOUNTER — Other Ambulatory Visit (HOSPITAL_COMMUNITY): Payer: Self-pay | Admitting: Internal Medicine

## 2013-04-17 DIAGNOSIS — Z1231 Encounter for screening mammogram for malignant neoplasm of breast: Secondary | ICD-10-CM

## 2013-05-14 ENCOUNTER — Other Ambulatory Visit (HOSPITAL_COMMUNITY): Payer: Self-pay | Admitting: Internal Medicine

## 2013-05-14 ENCOUNTER — Ambulatory Visit (HOSPITAL_COMMUNITY)
Admission: RE | Admit: 2013-05-14 | Discharge: 2013-05-14 | Disposition: A | Discharge status: Home / Self Care | Payer: Medicare Other | Source: Ambulatory Visit | Attending: Internal Medicine | Admitting: Internal Medicine

## 2013-05-14 DIAGNOSIS — Z1231 Encounter for screening mammogram for malignant neoplasm of breast: Secondary | ICD-10-CM

## 2013-06-13 ENCOUNTER — Telehealth: Payer: Self-pay | Admitting: Oncology

## 2013-06-13 NOTE — Telephone Encounter (Signed)
S/W PT ADVISED 5/11 APPT MOVED. PT STATED SHE WAS ALREADY AWARE AND THAT SHE PRINTED OUT HER NEW 6/8 APPT VIA MYCHART. I ADVISED PT SHE WOULD BE SEEING DR. MOORE ON 6/8 PT VERBALIZED UNDERSTANDING. 

## 2013-06-23 ENCOUNTER — Ambulatory Visit: Payer: Medicare Other | Admitting: Oncology

## 2013-06-23 ENCOUNTER — Other Ambulatory Visit: Payer: Medicare Other

## 2013-07-17 ENCOUNTER — Telehealth: Payer: Self-pay | Admitting: Hematology and Oncology

## 2013-07-17 NOTE — Telephone Encounter (Signed)
, °

## 2013-07-21 ENCOUNTER — Other Ambulatory Visit: Payer: Medicare Other

## 2013-07-21 ENCOUNTER — Ambulatory Visit: Payer: Medicare Other

## 2013-07-23 ENCOUNTER — Other Ambulatory Visit: Payer: Self-pay | Admitting: *Deleted

## 2013-07-23 DIAGNOSIS — Z853 Personal history of malignant neoplasm of breast: Secondary | ICD-10-CM

## 2013-07-23 DIAGNOSIS — D473 Essential (hemorrhagic) thrombocythemia: Secondary | ICD-10-CM

## 2013-07-24 ENCOUNTER — Other Ambulatory Visit: Payer: Self-pay | Admitting: *Deleted

## 2013-07-24 ENCOUNTER — Ambulatory Visit (HOSPITAL_BASED_OUTPATIENT_CLINIC_OR_DEPARTMENT_OTHER): Payer: BC Managed Care – PPO | Admitting: Hematology and Oncology

## 2013-07-24 ENCOUNTER — Other Ambulatory Visit (HOSPITAL_BASED_OUTPATIENT_CLINIC_OR_DEPARTMENT_OTHER): Payer: Medicare Other

## 2013-07-24 ENCOUNTER — Telehealth: Payer: Self-pay | Admitting: Hematology and Oncology

## 2013-07-24 VITALS — BP 160/84 | HR 66 | Temp 97.9°F | Resp 16 | Wt 229.0 lb

## 2013-07-24 DIAGNOSIS — D473 Essential (hemorrhagic) thrombocythemia: Secondary | ICD-10-CM

## 2013-07-24 DIAGNOSIS — Z853 Personal history of malignant neoplasm of breast: Secondary | ICD-10-CM

## 2013-07-24 DIAGNOSIS — C50519 Malignant neoplasm of lower-outer quadrant of unspecified female breast: Secondary | ICD-10-CM

## 2013-07-24 DIAGNOSIS — Z7901 Long term (current) use of anticoagulants: Secondary | ICD-10-CM

## 2013-07-24 DIAGNOSIS — Z86711 Personal history of pulmonary embolism: Secondary | ICD-10-CM

## 2013-07-24 LAB — CBC WITH DIFFERENTIAL/PLATELET
BASO%: 0.8 % (ref 0.0–2.0)
Basophils Absolute: 0 10*3/uL (ref 0.0–0.1)
EOS%: 0.6 % (ref 0.0–7.0)
Eosinophils Absolute: 0 10*3/uL (ref 0.0–0.5)
HCT: 44.2 % (ref 34.8–46.6)
HGB: 14.6 g/dL (ref 11.6–15.9)
LYMPH%: 34.5 % (ref 14.0–49.7)
MCH: 34.6 pg — AB (ref 25.1–34.0)
MCHC: 33 g/dL (ref 31.5–36.0)
MCV: 104.8 fL — AB (ref 79.5–101.0)
MONO#: 0.6 10*3/uL (ref 0.1–0.9)
MONO%: 13.8 % (ref 0.0–14.0)
NEUT#: 2.3 10*3/uL (ref 1.5–6.5)
NEUT%: 50.3 % (ref 38.4–76.8)
PLATELETS: 196 10*3/uL (ref 145–400)
RBC: 4.22 10*6/uL (ref 3.70–5.45)
RDW: 13.9 % (ref 11.2–14.5)
WBC: 4.5 10*3/uL (ref 3.9–10.3)
lymph#: 1.6 10*3/uL (ref 0.9–3.3)

## 2013-07-24 LAB — COMPREHENSIVE METABOLIC PANEL (CC13)
ALBUMIN: 3.4 g/dL — AB (ref 3.5–5.0)
ALK PHOS: 76 U/L (ref 40–150)
ALT: 14 U/L (ref 0–55)
AST: 18 U/L (ref 5–34)
Anion Gap: 7 mEq/L (ref 3–11)
BILIRUBIN TOTAL: 0.37 mg/dL (ref 0.20–1.20)
BUN: 15.3 mg/dL (ref 7.0–26.0)
CO2: 26 mEq/L (ref 22–29)
Calcium: 9.1 mg/dL (ref 8.4–10.4)
Chloride: 109 mEq/L (ref 98–109)
Creatinine: 0.9 mg/dL (ref 0.6–1.1)
GLUCOSE: 102 mg/dL (ref 70–140)
POTASSIUM: 3.9 meq/L (ref 3.5–5.1)
Sodium: 142 mEq/L (ref 136–145)
Total Protein: 6.8 g/dL (ref 6.4–8.3)

## 2013-07-24 NOTE — Telephone Encounter (Signed)
, °

## 2013-07-24 NOTE — Progress Notes (Deleted)
S/W PT ADVISED 5/11 APPT MOVED. PT STATED SHE WAS ALREADY AWARE AND THAT SHE PRINTED OUT HER NEW 6/8 APPT VIA MYCHART. I ADVISED PT SHE WOULD BE SEEING DR. Laurance Flatten ON 6/8 PT VERBALIZED UNDERSTANDING.

## 2013-07-24 NOTE — Progress Notes (Signed)
Patient FTKA today for lab and MD.  POF sent to reschedule.

## 2013-07-25 NOTE — Progress Notes (Signed)
OFFICE PROGRESS NOTE  CC Dr. Deland Pretty Dr. Gareth Eagle  Chief complaint: Follow up visit for essential thrombocytosis  DIAGNOSIS: 78 year old with 1. Essential thrombocytosis with Jak 2 mutation 2. Breast cancer stage 1 3. Pulmonary embolism on chronic anticoagulation with coumadin  PRIOR THERAPY: 1 ET: hydrea 500 mg BID to keep platelets below 400,000 2. Breast cancer observation 3. Coumadin managed by PCP  CURRENT THERAPY:Hydrea 500 mg po BID  INTERVAL HISTORY: Lori Page 78 y.o. female returns for follow up visit for essential thrombocytosis.  patient complains of bilateral lower extremity swelling and discomfort. She says that she was seen by the primary care physician and was given fluid pills for lower extremity swelling. She does complain of fatigue. She denies any fevers, chills, nausea or vomiting, no bleeding or bruising. She denies any shortness of breath, chest pains, palpitations, no blood in the stool or blood in the urine. Denies any weight loss or decrease in appetite. Denies any headaches or blurred vision   MEDICAL HISTORY: Past Medical History  Diagnosis Date  . Essential thrombocytosis   . Breast cancer   . HX: breast cancer 06/08/2011  . Essential thrombocytosis 06/08/2011    ALLERGIES:  has No Known Allergies.  MEDICATIONS:  Current Outpatient Prescriptions  Medication Sig Dispense Refill  . calcium carbonate (OS-CAL) 600 MG TABS Take 600 mg by mouth daily.      . furosemide (LASIX) 20 MG tablet Take 20 mg by mouth 2 (two) times a week.      . hydroxyurea (HYDREA) 500 MG capsule TAKE 1 CAPSULE BY MOUTH TWICE DAILY  180 capsule  2  . levothyroxine (SYNTHROID, LEVOTHROID) 50 MCG tablet Take 75 mcg by mouth daily.       . metoprolol succinate (TOPROL-XL) 100 MG 24 hr tablet Take 100 mg by mouth daily. Take with or immediately following a meal.      . pravastatin (PRAVACHOL) 40 MG tablet Take 40 mg by mouth daily.      . rivaroxaban (XARELTO) 20  MG TABS tablet Take 20 mg by mouth daily with supper.      . valsartan-hydrochlorothiazide (DIOVAN-HCT) 320-25 MG per tablet Take 0.5 tablets by mouth daily. 1/2 tablet daily       No current facility-administered medications for this visit.    SURGICAL HISTORY: No past surgical history on file.  REVIEW OF SYSTEMS:   A detailed 10 point review of systems is been assessed and pertinent symptoms as mentioned in interval history  PHYSICAL EXAMINATION:  HEENT PERRLA, sclerae anicteric, conjunctiva no pallor neck supple no JVD no thyromegaly no lymphadenopathy appreciated Respiratory system: Clear to auscultation, no rales heard  CVS: S1-S2 normal intensity, no murmurs  Abdomen: soft ,normoactive bowel sounds CNS: Alert and oriented x3, no focal deficits Extremities: 2+ Bilateral lower extremity edema noted Psych: Normal mood and affect Skin: Intact no petechiae no purpura noted Breast Exam:no masses or nipple discharge, no bilateral axillary lymphadenopathy ECOG PERFORMANCE STATUS: 0 - Asymptomatic  Blood pressure 160/84, pulse 66, temperature 97.9 F (36.6 C), temperature source Oral, resp. rate 16, weight 229 lb (103.874 kg).  LABORATORY DATA: Lab Results  Component Value Date   WBC 4.5 07/24/2013   HGB 14.6 07/24/2013   HCT 44.2 07/24/2013   MCV 104.8* 07/24/2013   PLT 196 07/24/2013      Chemistry      Component Value Date/Time   NA 142 07/24/2013 1220   NA 141 06/08/2011 0801   K 3.9  07/24/2013 1220   K 3.9 06/08/2011 0801   CL 109* 12/25/2011 0836   CL 107 06/08/2011 0801   CO2 26 07/24/2013 1220   CO2 26 06/08/2011 0801   BUN 15.3 07/24/2013 1220   BUN 15 06/08/2011 0801   CREATININE 0.9 07/24/2013 1220   CREATININE 0.95 06/08/2011 0801      Component Value Date/Time   CALCIUM 9.1 07/24/2013 1220   CALCIUM 8.7 06/08/2011 0801   ALKPHOS 76 07/24/2013 1220   ALKPHOS 67 06/08/2011 0801   AST 18 07/24/2013 1220   AST 21 06/08/2011 0801   ALT 14 07/24/2013 1220   ALT 17 06/08/2011  0801   BILITOT 0.37 07/24/2013 1220   BILITOT 0.4 06/08/2011 0801        ASSESSMENT/PLAN :  1. Essential thrombocytosis: Patient's platelet count nicely improved on hydroxyurea 500 mg by mouth twice daily. Her platelet count in fact today is 196,000. However in view of patient's lower extremity swelling and also improvement in platelet count we will decrease the hydroxyurea dose to 500 mg one day alternating with 1000 milligrams every other day.  2. History of Breast cancer-stage I: Recent left breast mammogram performed in April 2015 revealed no evidence of any malignancy  3. History of PE on Xarelto   Next followup visit in 3 months with CBC and differential and CMP  All questions were answered. The patient knows to call the clinic with any problems, questions or concerns. We can certainly see the patient much sooner if necessary.  I spent 20 minutes counseling the patient face to face. The total time spent in the appointment was 30 minutes.   Wilmon Arms, MD Medical/Oncology [place] [phone] (Office)

## 2013-08-12 ENCOUNTER — Other Ambulatory Visit (HOSPITAL_COMMUNITY): Payer: Self-pay | Admitting: Plastic Surgery

## 2013-08-12 DIAGNOSIS — T8543XA Leakage of breast prosthesis and implant, initial encounter: Secondary | ICD-10-CM

## 2013-08-22 ENCOUNTER — Ambulatory Visit (HOSPITAL_COMMUNITY)
Admission: RE | Admit: 2013-08-22 | Discharge: 2013-08-22 | Disposition: A | Discharge status: Home / Self Care | Payer: Medicare Other | Source: Ambulatory Visit | Attending: Plastic Surgery | Admitting: Plastic Surgery

## 2013-08-22 DIAGNOSIS — Z9010 Acquired absence of unspecified breast and nipple: Secondary | ICD-10-CM | POA: Insufficient documentation

## 2013-08-22 DIAGNOSIS — T8543XA Leakage of breast prosthesis and implant, initial encounter: Secondary | ICD-10-CM

## 2013-09-04 ENCOUNTER — Other Ambulatory Visit: Payer: Self-pay | Admitting: Oncology

## 2013-09-04 DIAGNOSIS — D473 Essential (hemorrhagic) thrombocythemia: Secondary | ICD-10-CM

## 2013-10-08 ENCOUNTER — Telehealth: Payer: Self-pay | Admitting: Hematology and Oncology

## 2013-10-08 NOTE — Telephone Encounter (Signed)
Cld & spoke to adv appt move to 9/10 due to BC-pt understood-pt aware of new time & date

## 2013-10-22 ENCOUNTER — Other Ambulatory Visit: Payer: Medicare Other

## 2013-10-22 ENCOUNTER — Ambulatory Visit: Payer: Medicare Other | Admitting: Hematology and Oncology

## 2013-10-22 ENCOUNTER — Other Ambulatory Visit: Payer: Self-pay

## 2013-10-22 DIAGNOSIS — Z853 Personal history of malignant neoplasm of breast: Secondary | ICD-10-CM

## 2013-10-23 ENCOUNTER — Telehealth: Payer: Self-pay | Admitting: Hematology and Oncology

## 2013-10-23 ENCOUNTER — Other Ambulatory Visit (HOSPITAL_BASED_OUTPATIENT_CLINIC_OR_DEPARTMENT_OTHER): Payer: Medicare Other

## 2013-10-23 ENCOUNTER — Encounter: Payer: Self-pay | Admitting: Hematology and Oncology

## 2013-10-23 ENCOUNTER — Ambulatory Visit (HOSPITAL_BASED_OUTPATIENT_CLINIC_OR_DEPARTMENT_OTHER): Payer: Medicare Other | Admitting: Hematology and Oncology

## 2013-10-23 VITALS — BP 137/63 | HR 63 | Temp 97.8°F | Resp 18 | Ht 64.0 in | Wt 230.2 lb

## 2013-10-23 DIAGNOSIS — D473 Essential (hemorrhagic) thrombocythemia: Secondary | ICD-10-CM

## 2013-10-23 DIAGNOSIS — Z86718 Personal history of other venous thrombosis and embolism: Secondary | ICD-10-CM | POA: Insufficient documentation

## 2013-10-23 DIAGNOSIS — C50311 Malignant neoplasm of lower-inner quadrant of right female breast: Secondary | ICD-10-CM

## 2013-10-23 DIAGNOSIS — Z853 Personal history of malignant neoplasm of breast: Secondary | ICD-10-CM

## 2013-10-23 LAB — CBC WITH DIFFERENTIAL/PLATELET
BASO%: 0.6 % (ref 0.0–2.0)
BASOS ABS: 0 10*3/uL (ref 0.0–0.1)
EOS%: 1.1 % (ref 0.0–7.0)
Eosinophils Absolute: 0.1 10*3/uL (ref 0.0–0.5)
HEMATOCRIT: 43.5 % (ref 34.8–46.6)
HEMOGLOBIN: 14.2 g/dL (ref 11.6–15.9)
LYMPH%: 42.1 % (ref 14.0–49.7)
MCH: 32.5 pg (ref 25.1–34.0)
MCHC: 32.6 g/dL (ref 31.5–36.0)
MCV: 99.5 fL (ref 79.5–101.0)
MONO#: 0.5 10*3/uL (ref 0.1–0.9)
MONO%: 10.7 % (ref 0.0–14.0)
NEUT#: 2.1 10*3/uL (ref 1.5–6.5)
NEUT%: 45.5 % (ref 38.4–76.8)
PLATELETS: 238 10*3/uL (ref 145–400)
RBC: 4.37 10*6/uL (ref 3.70–5.45)
RDW: 13 % (ref 11.2–14.5)
WBC: 4.7 10*3/uL (ref 3.9–10.3)
lymph#: 2 10*3/uL (ref 0.9–3.3)

## 2013-10-23 LAB — COMPREHENSIVE METABOLIC PANEL (CC13)
ALK PHOS: 63 U/L (ref 40–150)
ALT: 14 U/L (ref 0–55)
ANION GAP: 10 meq/L (ref 3–11)
AST: 19 U/L (ref 5–34)
Albumin: 3.3 g/dL — ABNORMAL LOW (ref 3.5–5.0)
BILIRUBIN TOTAL: 0.42 mg/dL (ref 0.20–1.20)
BUN: 14.5 mg/dL (ref 7.0–26.0)
CO2: 24 meq/L (ref 22–29)
CREATININE: 1 mg/dL (ref 0.6–1.1)
Calcium: 9 mg/dL (ref 8.4–10.4)
Chloride: 109 mEq/L (ref 98–109)
Glucose: 111 mg/dl (ref 70–140)
Potassium: 3.8 mEq/L (ref 3.5–5.1)
SODIUM: 142 meq/L (ref 136–145)
Total Protein: 6.7 g/dL (ref 6.4–8.3)

## 2013-10-23 NOTE — Progress Notes (Signed)
Patient Care Team: Horatio Pel, MD as PCP - General (Internal Medicine)  DIAGNOSIS: Essential thrombocytosis  SUMMARY OF ONCOLOGIC HISTORY:   Breast cancer of lower-inner quadrant of right female breast   09/23/1982 Surgery Left mastectomy foll by reconstruction; Stage 1   10/08/1982 Initial Diagnosis Breast cancer of lower-inner quadrant of right female breast   10/24/1982 - 03/26/1983 Chemotherapy Chemotherapy X 6 months   04/23/1983 - 10/23/1988 Anti-estrogen oral therapy Tamoxifen 20 mg daily    Essential thrombocytosis   04/26/2010 Procedure Bone marrow biopsy: Hypercellular with features of myeloproliferative neoplasm differential diagnosis ET vs P. vera   04/26/2010 -  Chemotherapy Hydrea 500 mg twice daily; later changed to alternate once daily and twice daily (due to leg swelling); changed to once daily 10/24/13    CHIEF COMPLIANT: Leg swelling improved with reducing dosage of Hydrea  INTERVAL HISTORY: [name] is amplified or left American lady with above-mentioned history of essential thrombocytosis for which she's been on Hydrea for the past 3 years without any major problems other than leg swelling. Limited use the dosage of Hydrea the leg swelling improved. She is now taking 2 tablets in one day and one tablet in the mid days. She reports no major problems tolerating Hydrea. She had a recent MRI of the breast evaluate for implant leakage. But there was no leakage noted on MRI. She continues to be on Xarelto with history of pulmonary embolism. Upon review of her blood work she has antiphospholipid antibody syndrome. She is on blood thinners for life because of recurrent blood thinners.   REVIEW OF SYSTEMS:   Constitutional: Denies fevers, chills or abnormal weight loss Eyes: Denies blurriness of vision Ears, nose, mouth, throat, and face: Denies mucositis or sore throat Respiratory: Denies cough, dyspnea or wheezes Cardiovascular: Denies palpitation, chest discomfort or  lower extremity swelling Gastrointestinal:  Denies nausea, heartburn or change in bowel habits Skin: Denies abnormal skin rashes Lymphatics: Denies new lymphadenopathy or easy bruising Neurological:Denies numbness, tingling or new weaknesses Behavioral/Psych: Mood is stable, no new changes  Breast:  denies any pain or lumps or nodules in either breasts All other systems were reviewed with the patient and are negative.  I have reviewed the past medical history, past surgical history, social history and family history with the patient and they are unchanged from previous note.  ALLERGIES:  has No Known Allergies.  MEDICATIONS:  Current Outpatient Prescriptions  Medication Sig Dispense Refill  . calcium carbonate (OS-CAL) 600 MG TABS Take 600 mg by mouth daily.      . furosemide (LASIX) 20 MG tablet Take 20 mg by mouth 2 (two) times a week.      . hydroxyurea (HYDREA) 500 MG capsule TAKE 1 CAPSULE BY MOUTH every other day.  Take 2 capsule by mouth on other days.      Marland Kitchen levothyroxine (SYNTHROID, LEVOTHROID) 50 MCG tablet Take 75 mcg by mouth daily.       . metoprolol succinate (TOPROL-XL) 100 MG 24 hr tablet Take 100 mg by mouth daily. Take with or immediately following a meal.      . pravastatin (PRAVACHOL) 40 MG tablet Take 40 mg by mouth daily.      . rivaroxaban (XARELTO) 20 MG TABS tablet Take 20 mg by mouth daily with supper.      . valsartan-hydrochlorothiazide (DIOVAN-HCT) 320-25 MG per tablet Take 0.5 tablets by mouth daily. 1/2 tablet daily       No current facility-administered medications for this visit.  PHYSICAL EXAMINATION: ECOG PERFORMANCE STATUS: 0 - Asymptomatic  Filed Vitals:   10/23/13 0856  BP: 137/63  Pulse: 63  Temp: 97.8 F (36.6 C)  Resp: 18   Filed Weights   10/23/13 0856  Weight: 230 lb 3.2 oz (104.418 kg)    GENERAL:alert, no distress and comfortable SKIN: skin color, texture, turgor are normal, no rashes or significant lesions EYES: normal,  Conjunctiva are pink and non-injected, sclera clear OROPHARYNX:no exudate, no erythema and lips, buccal mucosa, and tongue normal  NECK: supple, thyroid normal size, non-tender, without nodularity LYMPH:  no palpable lymphadenopathy in the cervical, axillary or inguinal LUNGS: clear to auscultation and percussion with normal breathing effort HEART: regular rate & rhythm and no murmurs and no lower extremity edema ABDOMEN:abdomen soft, non-tender and normal bowel sounds Musculoskeletal:no cyanosis of digits and no clubbing  NEURO: alert & oriented x 3 with fluent speech, no focal motor/sensory deficits BREAST: No palpable masses or nodules in either right or left breasts. No palpable axillary supraclavicular or infraclavicular adenopathy no breast tenderness or nipple discharge. Right breast implant in the scar appeared to be intact.   LABORATORY DATA:  I have reviewed the data as listed   Chemistry      Component Value Date/Time   NA 142 10/23/2013 0843   NA 141 06/08/2011 0801   K 3.8 10/23/2013 0843   K 3.9 06/08/2011 0801   CL 109* 12/25/2011 0836   CL 107 06/08/2011 0801   CO2 24 10/23/2013 0843   CO2 26 06/08/2011 0801   BUN 14.5 10/23/2013 0843   BUN 15 06/08/2011 0801   CREATININE 1.0 10/23/2013 0843   CREATININE 0.95 06/08/2011 0801      Component Value Date/Time   CALCIUM 9.0 10/23/2013 0843   CALCIUM 8.7 06/08/2011 0801   ALKPHOS 63 10/23/2013 0843   ALKPHOS 67 06/08/2011 0801   AST 19 10/23/2013 0843   AST 21 06/08/2011 0801   ALT 14 10/23/2013 0843   ALT 17 06/08/2011 0801   BILITOT 0.42 10/23/2013 0843   BILITOT 0.4 06/08/2011 0801       Lab Results  Component Value Date   WBC 4.7 10/23/2013   HGB 14.2 10/23/2013   HCT 43.5 10/23/2013   MCV 99.5 10/23/2013   PLT 238 10/23/2013   NEUTROABS 2.1 10/23/2013     RADIOGRAPHIC STUDIES: I have personally reviewed the radiology reports and agreed with their findings. No results found.   ASSESSMENT & PLAN:  Personal history of  venous thrombosis and embolism With history of antiphospholipid antibody and recurrent blood clots patient is on lifelong blood thinners. I agree with current anticoagulation regimen and plan.  Breast cancer of lower-inner quadrant of right female breast Stage I right breast cancer diagnosed in 1984: Status post mastectomy, adjuvant chemotherapy and adjuvant antiestrogen therapy. She has been on annual surveillance mammograms in March 2015 which were normal. Her breast exam today was also normal. We will continue to do surveillance mammograms once he undergo physical exams at that time. Recent MRI of the breasts was done to reassess if there was leakage in the implant  But there was no leakage.  Essential thrombocytosis Essential thrombosis well controlled on hydroxyurea. She was taking twice a day on Monday and once a day the other day. It appears her blood counts have been well controlled on this regimen. The leg swelling is much improved. After reviewing her entire history I did recommend that we could reduce the dose of Hydrea to 500  mg once a day. We will see her back in 3 months with another CBC with differential    Orders Placed This Encounter  Procedures  . CBC with Differential    Standing Status: Future     Number of Occurrences:      Standing Expiration Date: 10/23/2014   The patient has a good understanding of the overall plan. she agrees with it. She will call with any problems that may develop before her next visit here.  I spent 20 minutes counseling the patient face to face. The total time spent in the appointment was 25 minutes and more than 50% was on counseling and review of test results    Rulon Eisenmenger, MD 10/23/2013 9:39 AM

## 2013-10-23 NOTE — Telephone Encounter (Signed)
, °

## 2013-10-23 NOTE — Assessment & Plan Note (Signed)
With history of antiphospholipid antibody and recurrent blood clots patient is on lifelong blood thinners. I agree with current anticoagulation regimen and plan.

## 2013-10-23 NOTE — Assessment & Plan Note (Signed)
Essential thrombosis well controlled on hydroxyurea. She was taking twice a day on Monday and once a day the other day. It appears her blood counts have been well controlled on this regimen. The leg swelling is much improved. After reviewing her entire history I did recommend that we could reduce the dose of Hydrea to 500 mg once a day. We will see her back in 3 months with another CBC with differential

## 2013-10-23 NOTE — Assessment & Plan Note (Signed)
Stage I right breast cancer diagnosed in 1984: Status post mastectomy, adjuvant chemotherapy and adjuvant antiestrogen therapy. She has been on annual surveillance mammograms in March 2015 which were normal. Her breast exam today was also normal. We will continue to do surveillance mammograms once he undergo physical exams at that time. Recent MRI of the breasts was done to reassess if there was leakage in the implant  But there was no leakage.

## 2013-11-03 ENCOUNTER — Telehealth: Payer: Self-pay | Admitting: Hematology and Oncology

## 2013-11-03 NOTE — Telephone Encounter (Signed)
, °

## 2013-11-25 ENCOUNTER — Telehealth: Payer: Self-pay

## 2013-11-25 NOTE — Telephone Encounter (Signed)
Rcvd by fax from [facility] notes and labs dtd 11/25/13 Dr Shelia Media.  Copy to Dr Lindi Adie.  Original to scan.

## 2013-12-29 ENCOUNTER — Ambulatory Visit (INDEPENDENT_AMBULATORY_CARE_PROVIDER_SITE_OTHER): Payer: Medicare Other | Admitting: Internal Medicine

## 2013-12-29 ENCOUNTER — Encounter: Payer: Self-pay | Admitting: Internal Medicine

## 2013-12-29 ENCOUNTER — Other Ambulatory Visit (HOSPITAL_COMMUNITY): Payer: Self-pay | Admitting: Internal Medicine

## 2013-12-29 VITALS — BP 132/90 | HR 66 | Ht 65.0 in | Wt 228.0 lb

## 2013-12-29 DIAGNOSIS — R9431 Abnormal electrocardiogram [ECG] [EKG]: Secondary | ICD-10-CM

## 2013-12-29 DIAGNOSIS — Z8249 Family history of ischemic heart disease and other diseases of the circulatory system: Secondary | ICD-10-CM

## 2013-12-29 DIAGNOSIS — R0600 Dyspnea, unspecified: Secondary | ICD-10-CM

## 2013-12-29 DIAGNOSIS — I10 Essential (primary) hypertension: Secondary | ICD-10-CM

## 2013-12-29 DIAGNOSIS — D473 Essential (hemorrhagic) thrombocythemia: Secondary | ICD-10-CM

## 2013-12-29 DIAGNOSIS — Z86718 Personal history of other venous thrombosis and embolism: Secondary | ICD-10-CM

## 2013-12-29 DIAGNOSIS — E785 Hyperlipidemia, unspecified: Secondary | ICD-10-CM

## 2013-12-29 DIAGNOSIS — R0609 Other forms of dyspnea: Secondary | ICD-10-CM

## 2013-12-29 DIAGNOSIS — I4510 Unspecified right bundle-branch block: Secondary | ICD-10-CM

## 2013-12-29 NOTE — Patient Instructions (Signed)
Your physician has requested that you have an echocardiogram. Echocardiography is a painless test that uses sound waves to create images of your heart. It provides your doctor with information about the size and shape of your heart and how well your heart's chambers and valves are working. This procedure takes approximately one hour. There are no restrictions for this procedure.  Your physician has requested that you have a lexiscan myoview. For further information please visit [email or web address]. Please follow instruction sheet, as given.  Your physician recommends that you schedule a follow-up appointment after your tests, with Dr. Hilty.  

## 2013-12-29 NOTE — Progress Notes (Signed)
OFFICE NOTE  Chief Complaint:  Progressive shortness of breath, abnormal EKG  Primary Care Physician: Horatio Pel, MD  HPI:  Lori Page is a pleasant 78 year old female with past medical history significant for breast cancer, ET, and prior DVT on Xarelto. Recently she's been describing some increasing shortness of breath with exertion. An EKG performed at her primary care provider's office demonstrates a right bundle branch block and inferior T-wave abnormalities concerning for ischemia. There is a history of heart disease in her family including her mother who died suddenly of a massive MI at age 66 and a brother with a stroke and died PVCs as well as heart disease. She denies any chest pain. She does take Lasix however only twice weekly at most if needed. She occasionally gets some swelling and mostly in her left leg which could be related to post-thrombotic syndrome.  PMHx:  Past Medical History  Diagnosis Date  . Essential thrombocytosis   . Breast cancer   . HX: breast cancer 06/08/2011  . Essential thrombocytosis 06/08/2011    Past Surgical History  Procedure Laterality Date  . Mastectomy      FAMHx:  Family History  Problem Relation Age of Onset  . Heart disease Mother   . Heart attack Mother   . Cancer Father   . Stroke Brother   . Heart disease Brother     SOCHx:   reports that she has never smoked. She does not have any smokeless tobacco history on file. Her alcohol and drug histories are not on file.  ALLERGIES:  No Known Allergies  ROS: A comprehensive review of systems was negative except for: Respiratory: positive for dyspnea on exertion  HOME MEDS: Current Outpatient Prescriptions  Medication Sig Dispense Refill  . calcium carbonate (OS-CAL) 600 MG TABS Take 600 mg by mouth daily.    . furosemide (LASIX) 20 MG tablet Take 20 mg by mouth 2 (two) times a week.    . hydroxyurea (HYDREA) 500 MG capsule TAKE 1 CAPSULE BY MOUTH DAILY    .  levothyroxine (SYNTHROID, LEVOTHROID) 75 MCG tablet Take 1 tablet by mouth daily.  1  . metoprolol succinate (TOPROL-XL) 100 MG 24 hr tablet Take 100 mg by mouth daily. Take with or immediately following a meal.    . pravastatin (PRAVACHOL) 80 MG tablet Take 1 tablet by mouth daily.  1  . rivaroxaban (XARELTO) 20 MG TABS tablet Take 20 mg by mouth daily with supper.    . valsartan-hydrochlorothiazide (DIOVAN-HCT) 320-25 MG per tablet Take 0.5 tablets by mouth daily. 1/2 tablet daily     No current facility-administered medications for this visit.    LABS/IMAGING: No results found for this or any previous visit (from the past 48 hour(s)). No results found.  VITALS: BP 132/90 mmHg  Pulse 66  Ht 5\' 5"  (1.651 m)  Wt 228 lb (103.42 kg)  BMI 37.94 kg/m2  EXAM: General appearance: alert and no distress Neck: no carotid bruit and no JVD Lungs: clear to auscultation bilaterally Heart: regular rate and rhythm, S1: normal and S2: increased intensity Abdomen: soft, non-tender; bowel sounds normal; no masses,  no organomegaly Extremities: edema 1 +LLE Pulses: 2+ and symmetric Skin: Skin color, texture, turgor normal. No rashes or lesions Neurologic: Grossly normal Psych: Normal  EKG: Normal sinus rhythm at 66, right bundle branch block, inferior T-wave abnormalities  ASSESSMENT: 1. Abnormal EKG suggesting possible ischemia 2. Right bundle branch block 3. History of PE on Xarelto 4. Dyslipidemia  PLAN: 1.   Ms Kief has been having some increasing shortness of breath on exertion and has an abnormal EKG which could be ischemic.I would recommend a nuclear stress test to evaluate for ischemia. I also recommend an echocardiogram to evaluate for any possible c cardiomyopathy, including a bundle-branch induced cardiomyopathy. In addition, we can evaluate for possible pulmonary hypertension which could be related to chronic thrombo-embolic syndrome of the lungs. This really could give her  shortness of breath. Plan to see her back to discuss results of the studies in a few weeks.  Thanks as always for the kind referral.  Pixie Casino, MD, [facility] Attending Cardiologist [facility]  Teressa Mcglocklin C 12/29/2013, 3:47 PM

## 2013-12-30 ENCOUNTER — Encounter: Payer: Self-pay | Admitting: Internal Medicine

## 2013-12-31 ENCOUNTER — Encounter: Payer: Self-pay | Admitting: *Deleted

## 2014-01-14 ENCOUNTER — Telehealth (HOSPITAL_COMMUNITY): Payer: Self-pay

## 2014-01-14 NOTE — Telephone Encounter (Signed)
Encounter complete. 

## 2014-01-15 ENCOUNTER — Ambulatory Visit (HOSPITAL_COMMUNITY)
Admission: RE | Admit: 2014-01-15 | Discharge: 2014-01-15 | Disposition: A | Discharge status: Home / Self Care | Payer: Medicare Other | Source: Ambulatory Visit | Attending: Cardiovascular Disease | Admitting: Cardiovascular Disease

## 2014-01-15 DIAGNOSIS — I10 Essential (primary) hypertension: Secondary | ICD-10-CM | POA: Diagnosis not present

## 2014-01-15 DIAGNOSIS — R5383 Other fatigue: Secondary | ICD-10-CM | POA: Insufficient documentation

## 2014-01-15 DIAGNOSIS — E669 Obesity, unspecified: Secondary | ICD-10-CM | POA: Insufficient documentation

## 2014-01-15 DIAGNOSIS — R42 Dizziness and giddiness: Secondary | ICD-10-CM | POA: Diagnosis not present

## 2014-01-15 DIAGNOSIS — E785 Hyperlipidemia, unspecified: Secondary | ICD-10-CM | POA: Diagnosis not present

## 2014-01-15 DIAGNOSIS — R9431 Abnormal electrocardiogram [ECG] [EKG]: Secondary | ICD-10-CM | POA: Diagnosis not present

## 2014-01-15 DIAGNOSIS — Z8249 Family history of ischemic heart disease and other diseases of the circulatory system: Secondary | ICD-10-CM | POA: Insufficient documentation

## 2014-01-15 DIAGNOSIS — R0600 Dyspnea, unspecified: Secondary | ICD-10-CM

## 2014-01-15 DIAGNOSIS — R0609 Other forms of dyspnea: Secondary | ICD-10-CM | POA: Diagnosis not present

## 2014-01-15 DIAGNOSIS — I369 Nonrheumatic tricuspid valve disorder, unspecified: Secondary | ICD-10-CM

## 2014-01-15 MED ORDER — TECHNETIUM TC 99M SESTAMIBI GENERIC - CARDIOLITE
10.0000 | Freq: Once | INTRAVENOUS | Status: AC | PRN
  Administered 2014-01-15: 10 via INTRAVENOUS

## 2014-01-15 MED ORDER — REGADENOSON 0.4 MG/5ML IV SOLN
0.4000 mg | Freq: Once | INTRAVENOUS | Status: AC
  Administered 2014-01-15: 0.4 mg via INTRAVENOUS

## 2014-01-15 MED ORDER — TECHNETIUM TC 99M SESTAMIBI GENERIC - CARDIOLITE
30.0000 | Freq: Once | INTRAVENOUS | Status: AC | PRN
  Administered 2014-01-15: 30 via INTRAVENOUS

## 2014-01-15 NOTE — Progress Notes (Signed)
2D Echo Performed 01/15/2014    Marygrace Drought, RCS

## 2014-01-15 NOTE — Procedures (Addendum)
[facility] [address] [place] [address] [phone]  Cardiology Nuclear Med Study  Lori Page is a 78 y.o. female     MRN : [id number]     DOB: [birth year]  Procedure Date: 01/15/2014  Nuclear Med Background Indication for Stress Test:  Evaluation for Ischemia and Abnormal EKG History:  No prior cardiac or respiratory history reported;No prior NUC MPI for comparison. Cardiac Risk Factors: Family History - CAD, Hypertension, Lipids, Obesity, RBBB and PE;DVT;Thrombotic syndrome;Left leg edema  Symptoms:  Dizziness, DOE, Fatigue and Light-Headedness   Nuclear Pre-Procedure Caffeine/Decaff Intake:  7:00pm NPO After: 5:00am   IV Site: L Forearm  IV 0.9% NS with Angio Cath:  22g  Chest Size (in):  n/a IV Started by: Rolene Course, RN  Height: 5\' 5"  (1.651 m)  Cup Size: B;Pt is s/p Right mastectomy with breast implant;No sticks or B/P on right arm  BMI:  Body mass index is 37.94 kg/(m^2). Weight:  228 lb (103.42 kg)   Tech Comments:  n/a    Nuclear Med Study 1 or 2 day study: 1 day  Stress Test Type:  [place] Provider:  Lyman Bishop, MD   Resting Radionuclide: Technetium 48m Sestamibi  Resting Radionuclide Dose: 10.6 mCi   Stress Radionuclide:  Technetium 77m Sestamibi  Stress Radionuclide Dose: 30.2 mCi           Stress Protocol Rest HR: 58 Stress HR: 77  Rest BP: 141/83 Stress BP:141/83  Exercise Time (min): n/a METS: n/a          Dose of Adenosine (mg):  n/a Dose of Lexiscan: 0.4 mg  Dose of Atropine (mg): n/a Dose of Dobutamine: n/a mcg/kg/min (at max HR)  Stress Test Technologist: Mellody Memos, CCT Nuclear Technologist: Otho Perl, CNMT   Rest Procedure:  Myocardial perfusion imaging was performed at rest 45 minutes following the intravenous administration of Technetium 65m Sestamibi. Stress Procedure:  The patient received IV Lexiscan 0.4 mg over 15-seconds.  Technetium 16m  Sestamibi injected at 30-seconds.  There were no significant changes with Lexiscan.  Quantitative spect images were obtained after a 45 minute delay.  Transient Ischemic Dilatation (Normal <1.22):  1.12 QGS EDV:  75 ml QGS ESV:  21 ml LV Ejection Fraction: 72%     Rest ECG: NSR-RBBB  Stress ECG: No significant change from baseline ECG  QPS Raw Data Images:  Normal; no motion artifact; normal heart/lung ratio. Stress Images:  Normal homogeneous uptake in all areas of the myocardium. Rest Images:  Normal homogeneous uptake in all areas of the myocardium. Subtraction (SDS):  Normal  Impression Exercise Capacity:  Lexiscan with no exercise. BP Response:  Unable to take BP during infusion Clinical Symptoms:  Mild shortnes of breath ECG Impression:  No significant ST segment change suggestive of ischemia. Comparison with Prior Nuclear Study: No previous nuclear study performed  Overall Impression:  Normal stress nuclear study.  LV Wall Motion:  NL LV Function, EF 72%; NL Wall Motion   KELLY,THOMAS A, MD  01/15/2014 10:39 AM

## 2014-01-16 ENCOUNTER — Encounter (HOSPITAL_COMMUNITY): Payer: Medicare Other

## 2014-01-22 ENCOUNTER — Ambulatory Visit: Payer: Medicare Other | Admitting: Hematology and Oncology

## 2014-01-22 ENCOUNTER — Other Ambulatory Visit: Payer: Medicare Other

## 2014-01-27 ENCOUNTER — Other Ambulatory Visit (HOSPITAL_BASED_OUTPATIENT_CLINIC_OR_DEPARTMENT_OTHER): Payer: Medicare Other

## 2014-01-27 ENCOUNTER — Ambulatory Visit (HOSPITAL_BASED_OUTPATIENT_CLINIC_OR_DEPARTMENT_OTHER): Payer: Medicare Other | Admitting: Hematology and Oncology

## 2014-01-27 ENCOUNTER — Telehealth: Payer: Self-pay | Admitting: Hematology and Oncology

## 2014-01-27 VITALS — BP 147/92 | HR 63 | Temp 98.1°F | Resp 18 | Ht 65.0 in | Wt 224.9 lb

## 2014-01-27 DIAGNOSIS — Z853 Personal history of malignant neoplasm of breast: Secondary | ICD-10-CM

## 2014-01-27 DIAGNOSIS — D473 Essential (hemorrhagic) thrombocythemia: Secondary | ICD-10-CM

## 2014-01-27 DIAGNOSIS — C50311 Malignant neoplasm of lower-inner quadrant of right female breast: Secondary | ICD-10-CM

## 2014-01-27 LAB — CBC WITH DIFFERENTIAL/PLATELET
BASO%: 0.8 % (ref 0.0–2.0)
Basophils Absolute: 0 10*3/uL (ref 0.0–0.1)
EOS%: 1.1 % (ref 0.0–7.0)
Eosinophils Absolute: 0.1 10*3/uL (ref 0.0–0.5)
HCT: 45.2 % (ref 34.8–46.6)
HGB: 14.4 g/dL (ref 11.6–15.9)
LYMPH#: 1.5 10*3/uL (ref 0.9–3.3)
LYMPH%: 33.3 % (ref 14.0–49.7)
MCH: 30.7 pg (ref 25.1–34.0)
MCHC: 31.9 g/dL (ref 31.5–36.0)
MCV: 96.3 fL (ref 79.5–101.0)
MONO#: 0.5 10*3/uL (ref 0.1–0.9)
MONO%: 10.7 % (ref 0.0–14.0)
NEUT%: 54.1 % (ref 38.4–76.8)
NEUTROS ABS: 2.5 10*3/uL (ref 1.5–6.5)
PLATELETS: 292 10*3/uL (ref 145–400)
RBC: 4.69 10*6/uL (ref 3.70–5.45)
RDW: 13.5 % (ref 11.2–14.5)
WBC: 4.5 10*3/uL (ref 3.9–10.3)

## 2014-01-27 NOTE — Progress Notes (Signed)
Patient Care Team: Horatio Pel, MD as PCP - General (Internal Medicine)  DIAGNOSIS: Breast cancer of lower-inner quadrant of right female breast   Staging form: Breast, AJCC 6th Edition     Clinical: Stage I (T1, N0, M0) - Signed by Rulon Eisenmenger, MD on 10/23/2013     Pathologic: Stage I (T1, N0, M0) - Signed by Rulon Eisenmenger, MD on 10/23/2013   SUMMARY OF ONCOLOGIC HISTORY:   Breast cancer of lower-inner quadrant of right female breast   09/23/1982 Surgery Left mastectomy foll by reconstruction; Stage 1   10/08/1982 Initial Diagnosis Breast cancer of lower-inner quadrant of right female breast   10/24/1982 - 03/26/1983 Chemotherapy Chemotherapy X 6 months   04/23/1983 - 10/23/1988 Anti-estrogen oral therapy Tamoxifen 20 mg daily    Essential thrombocytosis   04/26/2010 Procedure Bone marrow biopsy: Hypercellular with features of myeloproliferative neoplasm differential diagnosis ET vs P. vera   04/26/2010 -  Chemotherapy Hydrea 500 mg twice daily; later changed to alternate once daily and twice daily (due to leg swelling); changed to once daily 10/24/13    CHIEF COMPLIANT: follow-up of essential thrombocytosis on Hydrea  INTERVAL HISTORY: Lori Page is a 60 ref clinic today with above-mentioned history of breast cancer but a recent history of essential thrombocytosis and is currently on Hydrea 500 mg once a day. I reduced the dosage because I did not think she needed to be on more than that. She is here for a blood count checked today. He reports no new pons or concerns. With the reduced dose of Hydrea, her leg swelling has improved.  REVIEW OF SYSTEMS:   Constitutional: Denies fevers, chills or abnormal weight loss Eyes: Denies blurriness of vision Ears, nose, mouth, throat, and face: Denies mucositis or sore throat Respiratory: Denies cough, dyspnea or wheezes Cardiovascular: Denies palpitation, chest discomfort or lower extremity swelling Gastrointestinal:  Denies nausea,  heartburn or change in bowel habits Skin: Denies abnormal skin rashes Lymphatics: Denies new lymphadenopathy or easy bruising Neurological:Denies numbness, tingling or new weaknesses Behavioral/Psych: Mood is stable, no new changes  Breast:  denies any pain or lumps or nodules in either breasts All other systems were reviewed with the patient and are negative.  I have reviewed the past medical history, past surgical history, social history and family history with the patient and they are unchanged from previous note.  ALLERGIES:  has No Known Allergies.  MEDICATIONS:  Current Outpatient Prescriptions  Medication Sig Dispense Refill  . calcium carbonate (OS-CAL) 600 MG TABS Take 600 mg by mouth daily.    . furosemide (LASIX) 20 MG tablet Take 20 mg by mouth 2 (two) times a week.    . hydroxyurea (HYDREA) 500 MG capsule TAKE 1 CAPSULE BY MOUTH DAILY    . levothyroxine (SYNTHROID, LEVOTHROID) 75 MCG tablet Take 1 tablet by mouth daily.  1  . metoprolol succinate (TOPROL-XL) 100 MG 24 hr tablet Take 100 mg by mouth daily. Take with or immediately following a meal.    . pravastatin (PRAVACHOL) 80 MG tablet Take 1 tablet by mouth daily.  1  . rivaroxaban (XARELTO) 20 MG TABS tablet Take 20 mg by mouth daily with supper.    . valsartan-hydrochlorothiazide (DIOVAN-HCT) 320-25 MG per tablet Take 0.5 tablets by mouth daily. 1/2 tablet daily     No current facility-administered medications for this visit.    PHYSICAL EXAMINATION: ECOG PERFORMANCE STATUS: 1 - Symptomatic but completely ambulatory  Filed Vitals:   01/27/14 1128  BP:  147/92  Pulse: 63  Temp: 98.1 F (36.7 C)  Resp: 18   Filed Weights   01/27/14 1128  Weight: 224 lb 14.4 oz (102.014 kg)    GENERAL:alert, no distress and comfortable SKIN: skin color, texture, turgor are normal, no rashes or significant lesions EYES: normal, Conjunctiva are pink and non-injected, sclera clear OROPHARYNX:no exudate, no erythema and lips,  buccal mucosa, and tongue normal  NECK: supple, thyroid normal size, non-tender, without nodularity LYMPH:  no palpable lymphadenopathy in the cervical, axillary or inguinal LUNGS: clear to auscultation and percussion with normal breathing effort HEART: regular rate & rhythm and no murmurs and 1+ lower extremity edema ABDOMEN:abdomen soft, non-tender and normal bowel sounds Musculoskeletal:no cyanosis of digits and no clubbing  NEURO: alert & oriented x 3 with fluent speech, no focal motor/sensory deficits   LABORATORY DATA:  I have reviewed the data as listed   Chemistry      Component Value Date/Time   NA 142 10/23/2013 0843   NA 141 06/08/2011 0801   K 3.8 10/23/2013 0843   K 3.9 06/08/2011 0801   CL 109* 12/25/2011 0836   CL 107 06/08/2011 0801   CO2 24 10/23/2013 0843   CO2 26 06/08/2011 0801   BUN 14.5 10/23/2013 0843   BUN 15 06/08/2011 0801   CREATININE 1.0 10/23/2013 0843   CREATININE 0.95 06/08/2011 0801      Component Value Date/Time   CALCIUM 9.0 10/23/2013 0843   CALCIUM 8.7 06/08/2011 0801   ALKPHOS 63 10/23/2013 0843   ALKPHOS 67 06/08/2011 0801   AST 19 10/23/2013 0843   AST 21 06/08/2011 0801   ALT 14 10/23/2013 0843   ALT 17 06/08/2011 0801   BILITOT 0.42 10/23/2013 0843   BILITOT 0.4 06/08/2011 0801       Lab Results  Component Value Date   WBC 4.5 01/27/2014   HGB 14.4 01/27/2014   HCT 45.2 01/27/2014   MCV 96.3 01/27/2014   PLT 292 01/27/2014   NEUTROABS 2.5 01/27/2014   ASSESSMENT & PLAN:  Essential thrombocytosis Essential thrombocytosis: Well controlled with once a day Hydrea. Patient is asymptomatic. Her leg swelling has remained stable on lower dose of Hydrea. We'll continue her on once a day Hydrea for now. Return to clinic in 6 months for lab work and follow-up. Her blood counts were reviewed today and they were normal. Patient will continue on blood thinners for history of recurrent blood clots.  Breast cancer of lower-inner  quadrant of right female breast Stage I right breast cancer diagnosed in 1984: Status post mastectomy, adjuvant chemotherapy and adjuvant antiestrogen therapy. She has been on annual surveillance mammograms in March 2015 which were normal   Orders Placed This Encounter  Procedures  . CBC with Differential    Standing Status: Future     Number of Occurrences:      Standing Expiration Date: 01/27/2015  . Comprehensive metabolic panel (Cmet) - [facility]    Standing Status: Future     Number of Occurrences:      Standing Expiration Date: 01/27/2015   The patient has a good understanding of the overall plan. she agrees with it. She will call with any problems that may develop before her next visit here.   Rulon Eisenmenger, MD 01/27/2014 12:17 PM

## 2014-01-27 NOTE — Telephone Encounter (Signed)
per pof to sch pt appt-gave pt copy of appt °

## 2014-01-27 NOTE — Assessment & Plan Note (Signed)
Essential thrombocytosis: Well controlled with once a day Hydrea. Patient is asymptomatic. Her leg swelling has remained stable on lower dose of Hydrea. We'll continue her on once a day Hydrea for now. Return to clinic in 6 months for lab work and follow-up. Her blood counts were reviewed today and they were normal. Patient will continue on blood thinners for history of recurrent blood clots.

## 2014-01-27 NOTE — Assessment & Plan Note (Signed)
Stage I right breast cancer diagnosed in 1984: Status post mastectomy, adjuvant chemotherapy and adjuvant antiestrogen therapy. She has been on annual surveillance mammograms in March 2015 which were normal 

## 2014-01-28 ENCOUNTER — Other Ambulatory Visit: Payer: Self-pay | Admitting: Endocrinology

## 2014-01-28 ENCOUNTER — Encounter: Payer: Self-pay | Admitting: Internal Medicine

## 2014-01-28 ENCOUNTER — Ambulatory Visit (INDEPENDENT_AMBULATORY_CARE_PROVIDER_SITE_OTHER): Payer: Medicare Other | Admitting: Internal Medicine

## 2014-01-28 VITALS — BP 134/86 | HR 68 | Ht 65.0 in | Wt 225.3 lb

## 2014-01-28 DIAGNOSIS — E041 Nontoxic single thyroid nodule: Secondary | ICD-10-CM

## 2014-01-28 DIAGNOSIS — I4510 Unspecified right bundle-branch block: Secondary | ICD-10-CM

## 2014-01-28 DIAGNOSIS — R0609 Other forms of dyspnea: Secondary | ICD-10-CM

## 2014-01-28 DIAGNOSIS — Z86718 Personal history of other venous thrombosis and embolism: Secondary | ICD-10-CM

## 2014-01-28 DIAGNOSIS — R0600 Dyspnea, unspecified: Secondary | ICD-10-CM

## 2014-01-28 DIAGNOSIS — R9431 Abnormal electrocardiogram [ECG] [EKG]: Secondary | ICD-10-CM

## 2014-01-28 NOTE — Patient Instructions (Signed)
Your physician wants you to follow-up in: 1 year with Dr. Hilty. You will receive a reminder letter in the mail two months in advance. If you don't receive a letter, please call our office to schedule the follow-up appointment.  

## 2014-01-28 NOTE — Progress Notes (Signed)
OFFICE NOTE  Chief Complaint:  Follow-up, no complaints  Primary Care Physician: Horatio Pel, MD  HPI:  Lori Page is a pleasant 78 year old female with past medical history significant for breast cancer, ET, and prior DVT on Xarelto. Recently she's been describing some increasing shortness of breath with exertion. An EKG performed at her primary care provider's office demonstrates a right bundle branch block and inferior T-wave abnormalities concerning for ischemia. There is a history of heart disease in her family including her mother who died suddenly of a massive MI at age 28 and a brother with a stroke and died PVCs as well as heart disease. She denies any chest pain. She does take Lasix however only twice weekly at most if needed. She occasionally gets some swelling and mostly in her left leg which could be related to post-thrombotic syndrome.  Lori Page returns today for follow-up. She reports her shortness of breath is not necessarily getting worse. She denies any chest pain. She underwent a nuclear stress test which was negative for ischemia. EF is preserved. She also had an echocardiogram which showed normal systolic function with mild diastolic dysfunction and mild pulmonary hypertension.  PMHx:  Past Medical History  Diagnosis Date  . Essential thrombocytosis   . Breast cancer   . HX: breast cancer 06/08/2011  . Essential thrombocytosis 06/08/2011    Past Surgical History  Procedure Laterality Date  . Mastectomy      FAMHx:  Family History  Problem Relation Age of Onset  . Heart disease Mother   . Heart attack Mother   . Cancer Father   . Stroke Brother   . Heart disease Brother   . Diabetes Brother     SOCHx:   reports that she has never smoked. She does not have any smokeless tobacco history on file. Her alcohol and drug histories are not on file.  ALLERGIES:  No Known Allergies  ROS: A comprehensive review of systems was  negative.  HOME MEDS: Current Outpatient Prescriptions  Medication Sig Dispense Refill  . calcium carbonate (OS-CAL) 600 MG TABS Take 600 mg by mouth daily.    . furosemide (LASIX) 20 MG tablet Take 20 mg by mouth 2 (two) times a week.    . hydroxyurea (HYDREA) 500 MG capsule TAKE 1 CAPSULE BY MOUTH DAILY    . levothyroxine (SYNTHROID, LEVOTHROID) 75 MCG tablet Take 1 tablet by mouth daily.  1  . metoprolol succinate (TOPROL-XL) 100 MG 24 hr tablet Take 100 mg by mouth daily. Take with or immediately following a meal.    . pravastatin (PRAVACHOL) 80 MG tablet Take 1 tablet by mouth daily.  1  . rivaroxaban (XARELTO) 20 MG TABS tablet Take 20 mg by mouth daily with supper.    . valsartan-hydrochlorothiazide (DIOVAN-HCT) 320-25 MG per tablet Take 0.5 tablets by mouth daily. 1/2 tablet daily     No current facility-administered medications for this visit.    LABS/IMAGING: Results for orders placed or performed in visit on 01/27/14 (from the past 48 hour(s))  CBC with Differential     Status: None   Collection Time: 01/27/14 10:46 AM  Result Value Ref Range   WBC 4.5 3.9 - 10.3 10e3/uL   NEUT# 2.5 1.5 - 6.5 10e3/uL   HGB 14.4 11.6 - 15.9 g/dL   HCT 45.2 34.8 - 46.6 %   Platelets 292 145 - 400 10e3/uL   MCV 96.3 79.5 - 101.0 fL   MCH 30.7 25.1 - 34.0 pg  MCHC 31.9 31.5 - 36.0 g/dL   RBC 4.69 3.70 - 5.45 10e6/uL   RDW 13.5 11.2 - 14.5 %   lymph# 1.5 0.9 - 3.3 10e3/uL   MONO# 0.5 0.1 - 0.9 10e3/uL   Eosinophils Absolute 0.1 0.0 - 0.5 10e3/uL   Basophils Absolute 0.0 0.0 - 0.1 10e3/uL   NEUT% 54.1 38.4 - 76.8 %   LYMPH% 33.3 14.0 - 49.7 %   MONO% 10.7 0.0 - 14.0 %   EOS% 1.1 0.0 - 7.0 %   BASO% 0.8 0.0 - 2.0 %   No results found.  VITALS: BP 134/86 mmHg  Pulse 68  Ht 5\' 5"  (1.651 m)  Wt 225 lb 4.8 oz (102.195 kg)  BMI 37.49 kg/m2  EXAM: deferred  EKG: deferred  ASSESSMENT: 1. Abnormal EKG suggesting possible ischemia 2. Right bundle branch block 3. History of PE  on Xarelto 4. Dyslipidemia  PLAN: 1.   Ms Nedrow had a low risk nuclear stress test and echo, which showed preserved systolic function. There is mild diastolic dysfunction and mild pulmonary hypertension. This could be related to chronic thrombo-embolic disease. I do not believe the degree of pulmonary hypertension explains her shortness of breath, however it is much more likely that her symptoms are related to her obesity and neck cavity. I recommend working on weight loss and exercise as the stress test and echo are reassuring. Plan to see her back annually or sooner as necessary  Thanks as always for the kind referral.  Pixie Casino, MD, [facility] Attending Cardiologist [facility]  Krrish Freund C 01/28/2014, 12:51 PM

## 2014-03-11 ENCOUNTER — Telehealth: Payer: Self-pay | Admitting: Hematology and Oncology

## 2014-03-11 NOTE — Telephone Encounter (Signed)
due to pal 6/13 appt moved to 6/20. left message for pt and mailed schedule.

## 2014-03-12 ENCOUNTER — Other Ambulatory Visit: Payer: Self-pay | Admitting: *Deleted

## 2014-03-12 DIAGNOSIS — D473 Essential (hemorrhagic) thrombocythemia: Secondary | ICD-10-CM

## 2014-03-12 DIAGNOSIS — C50311 Malignant neoplasm of lower-inner quadrant of right female breast: Secondary | ICD-10-CM

## 2014-03-12 MED ORDER — HYDROXYUREA 500 MG PO CAPS: 500.0000 mg | ORAL_CAPSULE | Freq: Every day | ORAL | Status: DC

## 2014-05-14 ENCOUNTER — Other Ambulatory Visit (HOSPITAL_COMMUNITY): Payer: Self-pay | Admitting: Internal Medicine

## 2014-05-14 DIAGNOSIS — Z1231 Encounter for screening mammogram for malignant neoplasm of breast: Secondary | ICD-10-CM

## 2014-05-22 ENCOUNTER — Ambulatory Visit (HOSPITAL_COMMUNITY)
Admission: RE | Admit: 2014-05-22 | Discharge: 2014-05-22 | Disposition: A | Discharge status: Home / Self Care | Payer: Medicare Other | Source: Ambulatory Visit | Attending: Internal Medicine | Admitting: Internal Medicine

## 2014-05-22 DIAGNOSIS — Z1231 Encounter for screening mammogram for malignant neoplasm of breast: Secondary | ICD-10-CM | POA: Diagnosis not present

## 2014-07-23 ENCOUNTER — Ambulatory Visit
Admission: RE | Admit: 2014-07-23 | Discharge: 2014-07-23 | Disposition: A | Discharge status: Home / Self Care | Payer: Medicare Other | Source: Ambulatory Visit | Attending: Endocrinology | Admitting: Endocrinology

## 2014-07-23 DIAGNOSIS — E041 Nontoxic single thyroid nodule: Secondary | ICD-10-CM

## 2014-07-27 ENCOUNTER — Other Ambulatory Visit: Payer: Medicare Other

## 2014-07-27 ENCOUNTER — Ambulatory Visit: Payer: Medicare Other | Admitting: Hematology and Oncology

## 2014-08-02 NOTE — Assessment & Plan Note (Signed)
Essential thrombocytosis Essential thrombocytosis: Well controlled with once a day Hydrea. Patient is asymptomatic. Her leg swelling has remained stable on lower dose of Hydrea. We'll continue her on once a day Hydrea for now. Return to clinic in 6 months for lab work and follow-up. Her blood counts were reviewed today and they were normal. Patient will continue on blood thinners for history of recurrent blood clots.  Breast cancer of lower-inner quadrant of right female breast Stage I right breast cancer diagnosed in 1984: Status post mastectomy, adjuvant chemotherapy and adjuvant antiestrogen therapy. She has been on annual surveillance mammograms in March 2015 which were normal

## 2014-08-03 ENCOUNTER — Other Ambulatory Visit: Payer: Self-pay

## 2014-08-03 ENCOUNTER — Telehealth: Payer: Self-pay | Admitting: *Deleted

## 2014-08-03 ENCOUNTER — Ambulatory Visit: Payer: Self-pay | Admitting: Hematology and Oncology

## 2014-08-03 ENCOUNTER — Telehealth: Payer: Self-pay | Admitting: Hematology and Oncology

## 2014-08-03 NOTE — Telephone Encounter (Signed)
Spoke with patient and informed her Dr. Lindi Adie has had some travel delays and needed to reschedule her appointment.  Informed her I would call her back with an appointment.

## 2014-08-03 NOTE — Telephone Encounter (Signed)
Patient was called to reschedule her cancelled appointment due to doctor delay  anne

## 2014-08-10 ENCOUNTER — Other Ambulatory Visit: Payer: Self-pay

## 2014-08-18 NOTE — Assessment & Plan Note (Signed)
Stage I right breast cancer diagnosed in 1984: Status post mastectomy, adjuvant chemotherapy and adjuvant antiestrogen therapy. She has been on annual surveillance mammograms in March 2015 which were normal

## 2014-08-18 NOTE — Assessment & Plan Note (Signed)
Essential thrombocytosis (high Risk) Essential thrombocytosis: Well controlled with once a day Hydrea. Patient is asymptomatic. Her leg swelling has remained stable on lower dose of Hydrea. We'll continue her on once a day Hydrea for now. Return to clinic in 6 months for lab work and follow-up. Her blood counts were reviewed today and they were normal. Patient will continue on blood thinners for history of recurrent blood clots.

## 2014-08-19 ENCOUNTER — Ambulatory Visit (HOSPITAL_BASED_OUTPATIENT_CLINIC_OR_DEPARTMENT_OTHER): Payer: Medicare Other | Admitting: Hematology and Oncology

## 2014-08-19 ENCOUNTER — Telehealth: Payer: Self-pay | Admitting: Hematology and Oncology

## 2014-08-19 ENCOUNTER — Encounter: Payer: Self-pay | Admitting: Hematology and Oncology

## 2014-08-19 ENCOUNTER — Other Ambulatory Visit (HOSPITAL_BASED_OUTPATIENT_CLINIC_OR_DEPARTMENT_OTHER): Payer: Medicare Other

## 2014-08-19 VITALS — BP 135/87 | HR 66 | Temp 98.0°F | Resp 18 | Ht 65.0 in | Wt 219.7 lb

## 2014-08-19 DIAGNOSIS — C50311 Malignant neoplasm of lower-inner quadrant of right female breast: Secondary | ICD-10-CM

## 2014-08-19 DIAGNOSIS — D473 Essential (hemorrhagic) thrombocythemia: Secondary | ICD-10-CM

## 2014-08-19 DIAGNOSIS — Z853 Personal history of malignant neoplasm of breast: Secondary | ICD-10-CM | POA: Diagnosis not present

## 2014-08-19 LAB — COMPREHENSIVE METABOLIC PANEL (CC13)
ALBUMIN: 3.4 g/dL — AB (ref 3.5–5.0)
ALT: 16 U/L (ref 0–55)
AST: 21 U/L (ref 5–34)
Alkaline Phosphatase: 62 U/L (ref 40–150)
Anion Gap: 8 mEq/L (ref 3–11)
BUN: 16 mg/dL (ref 7.0–26.0)
CALCIUM: 9.3 mg/dL (ref 8.4–10.4)
CO2: 24 mEq/L (ref 22–29)
Chloride: 109 mEq/L (ref 98–109)
Creatinine: 0.9 mg/dL (ref 0.6–1.1)
EGFR: 73 mL/min/{1.73_m2} — ABNORMAL LOW (ref >90)
Glucose: 108 mg/dl (ref 70–140)
POTASSIUM: 3.8 meq/L (ref 3.5–5.1)
Sodium: 141 mEq/L (ref 136–145)
TOTAL PROTEIN: 6.5 g/dL (ref 6.4–8.3)
Total Bilirubin: 0.49 mg/dL (ref 0.20–1.20)

## 2014-08-19 LAB — CBC WITH DIFFERENTIAL/PLATELET
BASO%: 0.9 % (ref 0.0–2.0)
Basophils Absolute: 0 10*3/uL (ref 0.0–0.1)
EOS ABS: 0.1 10*3/uL (ref 0.0–0.5)
EOS%: 1.3 % (ref 0.0–7.0)
HCT: 45.1 % (ref 34.8–46.6)
HGB: 14.9 g/dL (ref 11.6–15.9)
LYMPH#: 1.6 10*3/uL (ref 0.9–3.3)
LYMPH%: 29.3 % (ref 14.0–49.7)
MCH: 31.4 pg (ref 25.1–34.0)
MCHC: 33 g/dL (ref 31.5–36.0)
MCV: 95.1 fL (ref 79.5–101.0)
MONO#: 0.6 10*3/uL (ref 0.1–0.9)
MONO%: 10.3 % (ref 0.0–14.0)
NEUT%: 58.2 % (ref 38.4–76.8)
NEUTROS ABS: 3.2 10*3/uL (ref 1.5–6.5)
PLATELETS: 340 10*3/uL (ref 145–400)
RBC: 4.74 10*6/uL (ref 3.70–5.45)
RDW: 14.7 % — AB (ref 11.2–14.5)
WBC: 5.5 10*3/uL (ref 3.9–10.3)

## 2014-08-19 NOTE — Progress Notes (Signed)
Patient Care Team: Lori Pretty, MD as PCP - General (Internal Medicine)  DIAGNOSIS: Breast cancer of lower-inner quadrant of right female breast   Staging form: Breast, AJCC 6th Edition     Clinical: Stage I (T1, N0, M0) - Signed by Rulon Eisenmenger, MD on 10/23/2013     Pathologic: Stage I (T1, N0, M0) - Signed by Rulon Eisenmenger, MD on 10/23/2013   SUMMARY OF ONCOLOGIC HISTORY:   Breast cancer of lower-inner quadrant of right female breast   09/23/1982 Surgery Right mastectomy foll by reconstruction; Stage 1   10/24/1982 - 03/26/1983 Chemotherapy Chemotherapy X 6 months   04/23/1983 - 10/23/1988 Anti-estrogen oral therapy Tamoxifen 20 mg daily    Essential thrombocytosis   04/26/2010 Procedure Bone marrow biopsy: Hypercellular with features of myeloproliferative neoplasm differential diagnosis ET vs P. vera   04/26/2010 -  Chemotherapy Hydrea 500 mg twice daily; later changed to alternate once daily and twice daily (due to leg swelling); changed to once daily 10/24/13    CHIEF COMPLIANT: Follow-up of essential thrombocytosis  INTERVAL HISTORY: Lori Page is a 79 year old with above-mentioned history of right breast cancer in remission. She had essential thrombocytosis for which she has been on Hydrea since 2012. She has been tolerating Hydrea much better at the lower dosage. Denies any new problems or concerns with the medication. No problems with the blood clots or headaches or other symptoms of excessive platelets.  REVIEW OF SYSTEMS:   Constitutional: Denies fevers, chills or abnormal weight loss Eyes: Denies blurriness of vision Ears, nose, mouth, throat, and face: Denies mucositis or sore throat Respiratory: Denies cough, dyspnea or wheezes Cardiovascular: Denies palpitation, chest discomfort or lower extremity swelling Gastrointestinal:  Denies nausea, heartburn or change in bowel habits Skin: Denies abnormal skin rashes Lymphatics: Denies new lymphadenopathy or easy  bruising Neurological:Denies numbness, tingling or new weaknesses Behavioral/Psych: Mood is stable, no new changes  Breast:  denies any pain or lumps or nodules in either breasts All other systems were reviewed with the patient and are negative.  I have reviewed the past medical history, past surgical history, social history and family history with the patient and they are unchanged from previous note.  ALLERGIES:  has No Known Allergies.  MEDICATIONS:  Current Outpatient Prescriptions  Medication Sig Dispense Refill  . calcium carbonate (OS-CAL) 600 MG TABS Take 600 mg by mouth daily.    . furosemide (LASIX) 20 MG tablet Take 20 mg by mouth 2 (two) times a week.    . hydroxyurea (HYDREA) 500 MG capsule Take 1 capsule (500 mg total) by mouth daily. May take with food to minimize GI side effects. 30 capsule 5  . levothyroxine (SYNTHROID, LEVOTHROID) 75 MCG tablet Take 1 tablet by mouth daily.  1  . metoprolol succinate (TOPROL-XL) 100 MG 24 hr tablet Take 100 mg by mouth daily. Take with or immediately following a meal.    . pravastatin (PRAVACHOL) 80 MG tablet Take 1 tablet by mouth daily.  1  . rivaroxaban (XARELTO) 20 MG TABS tablet Take 20 mg by mouth daily with supper.    . valsartan-hydrochlorothiazide (DIOVAN-HCT) 320-25 MG per tablet Take 0.5 tablets by mouth daily. 1/2 tablet daily     No current facility-administered medications for this visit.    PHYSICAL EXAMINATION: ECOG PERFORMANCE STATUS: 0 - Asymptomatic  Filed Vitals:   08/19/14 0925  BP: 135/87  Pulse: 66  Temp: 98 F (36.7 C)  Resp: 18   Filed Weights   08/19/14 0925  Weight: 219 lb 11.2 oz (99.655 kg)    GENERAL:alert, no distress and comfortable SKIN: skin color, texture, turgor are normal, no rashes or significant lesions EYES: normal, Conjunctiva are pink and non-injected, sclera clear OROPHARYNX:no exudate, no erythema and lips, buccal mucosa, and tongue normal  NECK: supple, thyroid normal size,  non-tender, without nodularity LYMPH:  no palpable lymphadenopathy in the cervical, axillary or inguinal LUNGS: clear to auscultation and percussion with normal breathing effort HEART: regular rate & rhythm and no murmurs and no lower extremity edema ABDOMEN:abdomen soft, non-tender and normal bowel sounds Musculoskeletal:no cyanosis of digits and no clubbing  NEURO: alert & oriented x 3 with fluent speech, no focal motor/sensory deficits  LABORATORY DATA:  I have reviewed the data as listed   Chemistry      Component Value Date/Time   NA 142 10/23/2013 0843   NA 141 06/08/2011 0801   K 3.8 10/23/2013 0843   K 3.9 06/08/2011 0801   CL 109* 12/25/2011 0836   CL 107 06/08/2011 0801   CO2 24 10/23/2013 0843   CO2 26 06/08/2011 0801   BUN 14.5 10/23/2013 0843   BUN 15 06/08/2011 0801   CREATININE 1.0 10/23/2013 0843   CREATININE 0.95 06/08/2011 0801      Component Value Date/Time   CALCIUM 9.0 10/23/2013 0843   CALCIUM 8.7 06/08/2011 0801   ALKPHOS 63 10/23/2013 0843   ALKPHOS 67 06/08/2011 0801   AST 19 10/23/2013 0843   AST 21 06/08/2011 0801   ALT 14 10/23/2013 0843   ALT 17 06/08/2011 0801   BILITOT 0.42 10/23/2013 0843   BILITOT 0.4 06/08/2011 0801       Lab Results  Component Value Date   WBC 5.5 08/19/2014   HGB 14.9 08/19/2014   HCT 45.1 08/19/2014   MCV 95.1 08/19/2014   PLT 340 08/19/2014   NEUTROABS 3.2 08/19/2014     RADIOGRAPHIC STUDIES: I have personally reviewed the radiology reports and agreed with their findings. Mammogram 05/22/2014 is normal   ASSESSMENT & PLAN:  Breast cancer of lower-inner quadrant of right female breast Stage I right breast cancer diagnosed in 1984: Status post mastectomy, adjuvant chemotherapy and adjuvant antiestrogen therapy.  Breast Cancer Surveillance: 1. Breast exam 08/19/2014: Normal 2. Mammogram 05/22/2014 No abnormalities. Postsurgical changes. Breast Density Category B. I recommended that she get 3-D mammograms  for surveillance. Discussed the differences between different breast density categories.   Essential thrombocytosis (high Risk) Essential thrombocytosis: Well controlled with once a day Hydrea. Patient is asymptomatic. Her leg swelling has remained stable on lower dose of Hydrea. We'll continue her on once a day Hydrea for now. Return to clinic in 6 months for lab work and follow-up. Her blood counts were reviewed today and they were normal. Patient will continue on blood thinners for history of recurrent blood clots.   No orders of the defined types were placed in this encounter.   The patient has a good understanding of the overall plan. she agrees with it. she will call with any problems that may develop before the next visit here.   Rulon Eisenmenger, MD

## 2014-08-19 NOTE — Telephone Encounter (Signed)
Appointments made and avs pritned for patient °

## 2014-09-13 ENCOUNTER — Other Ambulatory Visit: Payer: Self-pay | Admitting: Hematology and Oncology

## 2014-09-14 NOTE — Telephone Encounter (Signed)
Last OV 08/19/14.  Next OV 02/19/15.  Chart Reviewed.

## 2014-10-20 ENCOUNTER — Emergency Department (HOSPITAL_COMMUNITY): Payer: Medicare Other

## 2014-10-20 ENCOUNTER — Emergency Department (HOSPITAL_COMMUNITY)
Admission: EM | Admit: 2014-10-20 | Discharge: 2014-10-20 | Disposition: A | Discharge status: Home / Self Care | Payer: Medicare Other | Attending: Emergency Medicine | Admitting: Emergency Medicine

## 2014-10-20 ENCOUNTER — Encounter (HOSPITAL_COMMUNITY): Payer: Self-pay | Admitting: Emergency Medicine

## 2014-10-20 DIAGNOSIS — Z862 Personal history of diseases of the blood and blood-forming organs and certain disorders involving the immune mechanism: Secondary | ICD-10-CM | POA: Diagnosis not present

## 2014-10-20 DIAGNOSIS — W1839XA Other fall on same level, initial encounter: Secondary | ICD-10-CM | POA: Insufficient documentation

## 2014-10-20 DIAGNOSIS — Y929 Unspecified place or not applicable: Secondary | ICD-10-CM | POA: Diagnosis not present

## 2014-10-20 DIAGNOSIS — Y999 Unspecified external cause status: Secondary | ICD-10-CM | POA: Diagnosis not present

## 2014-10-20 DIAGNOSIS — S0990XA Unspecified injury of head, initial encounter: Secondary | ICD-10-CM | POA: Diagnosis present

## 2014-10-20 DIAGNOSIS — Z853 Personal history of malignant neoplasm of breast: Secondary | ICD-10-CM | POA: Diagnosis not present

## 2014-10-20 DIAGNOSIS — Y939 Activity, unspecified: Secondary | ICD-10-CM | POA: Diagnosis not present

## 2014-10-20 DIAGNOSIS — Z79899 Other long term (current) drug therapy: Secondary | ICD-10-CM | POA: Diagnosis not present

## 2014-10-20 DIAGNOSIS — Z86711 Personal history of pulmonary embolism: Secondary | ICD-10-CM | POA: Insufficient documentation

## 2014-10-20 DIAGNOSIS — Z7901 Long term (current) use of anticoagulants: Secondary | ICD-10-CM | POA: Diagnosis not present

## 2014-10-20 DIAGNOSIS — S0003XA Contusion of scalp, initial encounter: Secondary | ICD-10-CM | POA: Diagnosis not present

## 2014-10-20 DIAGNOSIS — W19XXXA Unspecified fall, initial encounter: Secondary | ICD-10-CM

## 2014-10-20 NOTE — ED Notes (Signed)
Patient transported to CT 

## 2014-10-20 NOTE — Discharge Instructions (Signed)
Return without fail for worsening symptoms including worsening pain, confusion, vomiting and unable to keep down food/fluids, gait instability, or any other symptoms concerning to you.    Hematoma A hematoma is a collection of blood under the skin, in an organ, in a body space, in a joint space, or in other tissue. The blood can clot to form a lump that you can see and feel. The lump is often firm and may sometimes become sore and tender. Most hematomas get better in a few days to weeks. However, some hematomas may be serious and require medical care. Hematomas can range in size from very small to very large. CAUSES  A hematoma can be caused by a blunt or penetrating injury. It can also be caused by spontaneous leakage from a blood vessel under the skin. Spontaneous leakage from a blood vessel is more likely to occur in older people, especially those taking blood thinners. Sometimes, a hematoma can develop after certain medical procedures. SIGNS AND SYMPTOMS   A firm lump on the body.  Possible pain and tenderness in the area.  Bruising.Blue, dark blue, purple-red, or yellowish skin may appear at the site of the hematoma if the hematoma is close to the surface of the skin. For hematomas in deeper tissues or body spaces, the signs and symptoms may be subtle. For example, an intra-abdominal hematoma may cause abdominal pain, weakness, fainting, and shortness of breath. An intracranial hematoma may cause a headache or symptoms such as weakness, trouble speaking, or a change in consciousness. DIAGNOSIS  A hematoma can usually be diagnosed based on your medical history and a physical exam. Imaging tests may be needed if your health care provider suspects a hematoma in deeper tissues or body spaces, such as the abdomen, head, or chest. These tests may include ultrasonography or a CT scan.  TREATMENT  Hematomas usually go away on their own over time. Rarely does the blood need to be drained out of the  body. Large hematomas or those that may affect vital organs will sometimes need surgical drainage or monitoring. HOME CARE INSTRUCTIONS   Apply ice to the injured area:   Put ice in a plastic bag.   Place a towel between your skin and the bag.   Leave the ice on for 20 minutes, 2-3 times a day for the first 1 to 2 days.   After the first 2 days, switch to using warm compresses on the hematoma.   Elevate the injured area to help decrease pain and swelling. Wrapping the area with an elastic bandage may also be helpful. Compression helps to reduce swelling and promotes shrinking of the hematoma. Make sure the bandage is not wrapped too tight.   If your hematoma is on a lower extremity and is painful, crutches may be helpful for a couple days.   Only take over-the-counter or prescription medicines as directed by your health care provider. SEEK IMMEDIATE MEDICAL CARE IF:   You have increasing pain, or your pain is not controlled with medicine.   You have a fever.   You have worsening swelling or discoloration.   Your skin over the hematoma breaks or starts bleeding.   Your hematoma is in your chest or abdomen and you have weakness, shortness of breath, or a change in consciousness.  Your hematoma is on your scalp (caused by a fall or injury) and you have a worsening headache or a change in alertness or consciousness. MAKE SURE YOU:   Understand these instructions.  Will watch your condition.  Will get help right away if you are not doing well or get worse. Document Released: 09/14/2003 Document Revised: 10/02/2012 Document Reviewed: 07/10/2012 [facility] Patient Information 2015 [place], [place]. This information is not intended to replace advice given to you by your health care provider. Make sure you discuss any questions you have with your health care provider.

## 2014-10-20 NOTE — ED Notes (Addendum)
Pt sts fell this am and hit head on concrete; pt denies LOC; pt sts taking xerelto and here for eval; pt with abrasion to left elbow and leg

## 2014-10-20 NOTE — ED Provider Notes (Signed)
CSN: [id number]     Arrival date & time 10/20/14  1144 History   First MD Initiated Contact with Patient 10/20/14 1632     Chief Complaint  Patient presents with  . Fall     (Consider location/radiation/quality/duration/timing/severity/associated sxs/prior Treatment) HPI  79 year old female with history of essential thrombocytosis and PE on Xarelto who presents after fall. She was trying to step up onto the back porch step and lost balance. She fell backwards hitting her head on concrete. Did not have LOC. Daughter helped her get up and was able to ambulate steadily. Tetanus updated within last 5 years. Denies headache, vision changes, neck pain, back pain, chest pain, numbness/weakness, gait instability, abdominal pain, nausea/vomiting.   Past Medical History  Diagnosis Date  . Essential thrombocytosis   . Breast cancer   . HX: breast cancer 06/08/2011  . Essential thrombocytosis 06/08/2011   Past Surgical History  Procedure Laterality Date  . Mastectomy     Family History  Problem Relation Age of Onset  . Heart disease Mother   . Heart attack Mother   . Cancer Father   . Stroke Brother   . Heart disease Brother   . Diabetes Brother    Social History  Substance Use Topics  . Smoking status: Never Smoker   . Smokeless tobacco: None  . Alcohol Use: None   OB History    No data available     Review of Systems 10/14 systems reviewed and are negative other than those stated in the HPI   Allergies  Review of patient's allergies indicates no known allergies.  Home Medications   Prior to Admission medications   Medication Sig Start Date End Date Taking? Authorizing Provider  calcium carbonate (OS-CAL) 600 MG TABS Take 600 mg by mouth daily.    Historical Provider, MD  furosemide (LASIX) 20 MG tablet Take 20 mg by mouth 2 (two) times a week.    Historical Provider, MD  hydroxyurea (HYDREA) 500 MG capsule TAKE 1 CAPSULE (500 MG TOTAL) BY MOUTH DAILY. MAY TAKE WITH FOOD TO  MINIMIZE GI SIDE EFFECTS. 09/14/14   Nicholas Lose, MD  metoprolol succinate (TOPROL-XL) 100 MG 24 hr tablet Take 100 mg by mouth daily. Take with or immediately following a meal.    Historical Provider, MD  pravastatin (PRAVACHOL) 80 MG tablet Take 1 tablet by mouth daily. 12/23/13   Historical Provider, MD  rivaroxaban (XARELTO) 20 MG TABS tablet Take 20 mg by mouth daily with supper.    Historical Provider, MD  telmisartan-hydrochlorothiazide (MICARDIS HCT) 80-25 MG per tablet Take 1 tablet by mouth daily. 08/12/14   Historical Provider, MD   BP 155/78 mmHg  Pulse 64  Temp(Src) 97.8 F (36.6 C) (Oral)  Resp 16  Ht 5\' 5"  (1.651 m)  Wt 219 lb (99.338 kg)  BMI 36.44 kg/m2  SpO2 98% Physical Exam Physical Exam  Nursing note and vitals reviewed. Constitutional: Well developed, well nourished, non-toxic, and in no acute distress Head: Normocephalic. Hematoma noted over the occipital region of her scalp. Mouth/Throat: Oropharynx is clear and moist.  Ears: Bilateral tympanic membranes are clear  Neck: Normal range of motion. Neck supple. No cervical spine tenderness.  Cardiovascular: Normal rate and regular rhythm.   Pulmonary/Chest: Effort normal and breath sounds normal.  Abdominal: Soft. There is no tenderness. There is no rebound and no guarding.  Musculoskeletal: Normal range of motion.  no deformities, swelling, or tenderness. No hip or pelvic tenderness. No TLS spine tenderness.  Neurological:  Alert, no facial droop, fluent speech, moves all extremities symmetrically, sensation in tact throughout  Skin: Skin is warm and dry.  Psychiatric: Cooperative  ED Course  Procedures (including critical care time) Labs Review Labs Reviewed - No data to display  Imaging Review Ct Head Wo Contrast  10/20/2014   CLINICAL DATA:  F ive at status post fall today with a blow to the back of the head. Initial encounter.  EXAM: CT HEAD WITHOUT CONTRAST  TECHNIQUE: Contiguous axial images were obtained  from the base of the skull through the vertex without intravenous contrast.  COMPARISON:  None.  FINDINGS: There is no evidence of acute intracranial abnormality including hemorrhage, infarct, mass lesion, mass effect, midline shift or abnormal extra-axial fluid collection. No hydrocephalus or pneumocephalus. The calvarium is intact. Imaged paranasal sinuses and mastoid air cells are clear.  IMPRESSION: Negative head CT.   Electronically Signed   By: Inge Rise M.D.   On: 10/20/2014 14:58   Ct Cervical Spine Wo Contrast  10/20/2014   CLINICAL DATA:  79 year old female with fall and neck pain.  EXAM: CT CERVICAL SPINE WITHOUT CONTRAST  TECHNIQUE: Multidetector CT imaging of the cervical spine was performed without intravenous contrast. Multiplanar CT image reconstructions were also generated.  COMPARISON:  None.  FINDINGS: There is no acute fracture or subluxation of the cervical spine.There multilevel degenerative changes. There is small calcification of the posterior longitudinal ligament most prominent at C3-C6. There is multilevel facet hypertrophy.The odontoid and spinous processes are intact.There is normal anatomic alignment of the C1-C2 lateral masses. The visualized soft tissues appear unremarkable.  There is a 1.5 cm partially calcified left thyroid nodule. Smaller calcific foci noted in the right lobe of the thyroid gland. Ultrasound is recommended for further evaluation of the thyroid gland.  IMPRESSION: No acute fracture or subluxation of the cervical spine.  Bilateral thyroid nodules.   Electronically Signed   By: Anner Crete M.D.   On: 10/20/2014 18:53   I have personally reviewed and evaluated these images and lab results as part of my medical decision-making.   EKG Interpretation None      MDM   Final diagnoses:  Fall, initial encounter  Scalp hematoma, initial encounter    79 year old female on Xarelto who presents after mechanical fall with head strike. She is  appropriate and in no acute distress on presentation. She has a scalp hematoma noted. She is neurologically intact. No other acute injuries are noted side from superficial abrasions noted to the left arm and left shin. Tetanus is up-to-date. We will obtain CT head and cervical spine. These are visualized and reveals no acute traumatic injuries. She is felt appropriate for discharge home.  Strict return and follow-up instructions are reviewed. She expressed understanding of all discharge instructions for comfortable to plan of care.   Forde Dandy, MD 10/20/14 [phone]

## 2014-11-09 ENCOUNTER — Emergency Department (HOSPITAL_COMMUNITY): Payer: Medicare Other

## 2014-11-09 ENCOUNTER — Inpatient Hospital Stay (HOSPITAL_COMMUNITY)
Admission: EM | Admit: 2014-11-09 | Discharge: 2014-11-11 | DRG: 872 | Disposition: A | Discharge status: Home / Self Care | Payer: Medicare Other | Attending: Internal Medicine | Admitting: Internal Medicine

## 2014-11-09 ENCOUNTER — Encounter (HOSPITAL_COMMUNITY): Payer: Self-pay

## 2014-11-09 ENCOUNTER — Other Ambulatory Visit: Payer: Self-pay

## 2014-11-09 DIAGNOSIS — R55 Syncope and collapse: Secondary | ICD-10-CM | POA: Diagnosis present

## 2014-11-09 DIAGNOSIS — Z823 Family history of stroke: Secondary | ICD-10-CM

## 2014-11-09 DIAGNOSIS — T501X5A Adverse effect of loop [high-ceiling] diuretics, initial encounter: Secondary | ICD-10-CM | POA: Diagnosis present

## 2014-11-09 DIAGNOSIS — Z79899 Other long term (current) drug therapy: Secondary | ICD-10-CM | POA: Diagnosis not present

## 2014-11-09 DIAGNOSIS — N182 Chronic kidney disease, stage 2 (mild): Secondary | ICD-10-CM | POA: Diagnosis present

## 2014-11-09 DIAGNOSIS — R509 Fever, unspecified: Secondary | ICD-10-CM | POA: Diagnosis present

## 2014-11-09 DIAGNOSIS — Z86718 Personal history of other venous thrombosis and embolism: Secondary | ICD-10-CM | POA: Diagnosis not present

## 2014-11-09 DIAGNOSIS — Z9011 Acquired absence of right breast and nipple: Secondary | ICD-10-CM | POA: Diagnosis present

## 2014-11-09 DIAGNOSIS — Z833 Family history of diabetes mellitus: Secondary | ICD-10-CM

## 2014-11-09 DIAGNOSIS — E876 Hypokalemia: Secondary | ICD-10-CM

## 2014-11-09 DIAGNOSIS — E785 Hyperlipidemia, unspecified: Secondary | ICD-10-CM

## 2014-11-09 DIAGNOSIS — Y92002 Bathroom of unspecified non-institutional (private) residence single-family (private) house as the place of occurrence of the external cause: Secondary | ICD-10-CM | POA: Diagnosis not present

## 2014-11-09 DIAGNOSIS — Z9181 History of falling: Secondary | ICD-10-CM | POA: Diagnosis not present

## 2014-11-09 DIAGNOSIS — D473 Essential (hemorrhagic) thrombocythemia: Secondary | ICD-10-CM | POA: Diagnosis present

## 2014-11-09 DIAGNOSIS — I10 Essential (primary) hypertension: Secondary | ICD-10-CM

## 2014-11-09 DIAGNOSIS — A419 Sepsis, unspecified organism: Principal | ICD-10-CM

## 2014-11-09 DIAGNOSIS — Z7902 Long term (current) use of antithrombotics/antiplatelets: Secondary | ICD-10-CM

## 2014-11-09 DIAGNOSIS — Z853 Personal history of malignant neoplasm of breast: Secondary | ICD-10-CM

## 2014-11-09 DIAGNOSIS — Z8249 Family history of ischemic heart disease and other diseases of the circulatory system: Secondary | ICD-10-CM

## 2014-11-09 DIAGNOSIS — C50311 Malignant neoplasm of lower-inner quadrant of right female breast: Secondary | ICD-10-CM | POA: Diagnosis not present

## 2014-11-09 DIAGNOSIS — N390 Urinary tract infection, site not specified: Secondary | ICD-10-CM | POA: Diagnosis present

## 2014-11-09 DIAGNOSIS — W19XXXA Unspecified fall, initial encounter: Secondary | ICD-10-CM | POA: Diagnosis present

## 2014-11-09 DIAGNOSIS — W1839XA Other fall on same level, initial encounter: Secondary | ICD-10-CM | POA: Diagnosis present

## 2014-11-09 DIAGNOSIS — I129 Hypertensive chronic kidney disease with stage 1 through stage 4 chronic kidney disease, or unspecified chronic kidney disease: Secondary | ICD-10-CM | POA: Diagnosis present

## 2014-11-09 DIAGNOSIS — D72829 Elevated white blood cell count, unspecified: Secondary | ICD-10-CM

## 2014-11-09 DIAGNOSIS — I5032 Chronic diastolic (congestive) heart failure: Secondary | ICD-10-CM | POA: Diagnosis present

## 2014-11-09 HISTORY — DX: Essential (primary) hypertension: I10

## 2014-11-09 LAB — CBC WITH DIFFERENTIAL/PLATELET
BASOS ABS: 0 10*3/uL (ref 0.0–0.1)
BASOS PCT: 0 %
BASOS PCT: 0 %
Basophils Absolute: 0 10*3/uL (ref 0.0–0.1)
EOS ABS: 0 10*3/uL (ref 0.0–0.7)
EOS ABS: 0 10*3/uL (ref 0.0–0.7)
EOS PCT: 0 %
Eosinophils Relative: 0 %
HCT: 45 % (ref 36.0–46.0)
HCT: 38.6 % (ref 36.0–46.0)
HEMOGLOBIN: 12.9 g/dL (ref 12.0–15.0)
Hemoglobin: 15.1 g/dL — ABNORMAL HIGH (ref 12.0–15.0)
Lymphocytes Relative: 4 %
Lymphocytes Relative: 6 %
Lymphs Abs: 0.5 10*3/uL — ABNORMAL LOW (ref 0.7–4.0)
Lymphs Abs: 0.7 10*3/uL (ref 0.7–4.0)
MCH: 31.8 pg (ref 26.0–34.0)
MCH: 31.5 pg (ref 26.0–34.0)
MCHC: 33.6 g/dL (ref 30.0–36.0)
MCHC: 33.4 g/dL (ref 30.0–36.0)
MCV: 94.7 fL (ref 78.0–100.0)
MCV: 94.1 fL (ref 78.0–100.0)
MONO ABS: 1.1 10*3/uL — AB (ref 0.1–1.0)
Monocytes Absolute: 0.5 10*3/uL (ref 0.1–1.0)
Monocytes Relative: 8 %
Monocytes Relative: 4 %
NEUTROS PCT: 90 %
Neutro Abs: 12.3 10*3/uL — ABNORMAL HIGH (ref 1.7–7.7)
Neutro Abs: 10 10*3/uL — ABNORMAL HIGH (ref 1.7–7.7)
Neutrophils Relative %: 88 %
PLATELETS: 299 10*3/uL (ref 150–400)
Platelets: 276 10*3/uL (ref 150–400)
RBC: 4.75 MIL/uL (ref 3.87–5.11)
RBC: 4.1 MIL/uL (ref 3.87–5.11)
RDW: 14.9 % (ref 11.5–15.5)
RDW: 14.8 % (ref 11.5–15.5)
WBC: 13.9 10*3/uL — ABNORMAL HIGH (ref 4.0–10.5)
WBC: 11.2 10*3/uL — AB (ref 4.0–10.5)

## 2014-11-09 LAB — I-STAT CG4 LACTIC ACID, ED
LACTIC ACID, VENOUS: 2.32 mmol/L — AB (ref 0.5–2.0)
Lactic Acid, Venous: 1.83 mmol/L (ref 0.5–2.0)

## 2014-11-09 LAB — URINALYSIS, ROUTINE W REFLEX MICROSCOPIC
BILIRUBIN URINE: NEGATIVE
Glucose, UA: NEGATIVE mg/dL
HGB URINE DIPSTICK: NEGATIVE
Ketones, ur: NEGATIVE mg/dL
Leukocytes, UA: NEGATIVE
NITRITE: NEGATIVE
PROTEIN: NEGATIVE mg/dL
Specific Gravity, Urine: 1.017 (ref 1.005–1.030)
UROBILINOGEN UA: 0.2 mg/dL (ref 0.0–1.0)
pH: 6 (ref 5.0–8.0)

## 2014-11-09 LAB — COMPREHENSIVE METABOLIC PANEL
ALBUMIN: 3.1 g/dL — AB (ref 3.5–5.0)
ALBUMIN: 2.7 g/dL — AB (ref 3.5–5.0)
ALT: 16 U/L (ref 14–54)
ALT: 14 U/L (ref 14–54)
ANION GAP: 10 (ref 5–15)
ANION GAP: 7 (ref 5–15)
AST: 27 U/L (ref 15–41)
AST: 28 U/L (ref 15–41)
Alkaline Phosphatase: 61 U/L (ref 38–126)
Alkaline Phosphatase: 55 U/L (ref 38–126)
BILIRUBIN TOTAL: 0.6 mg/dL (ref 0.3–1.2)
BILIRUBIN TOTAL: 0.6 mg/dL (ref 0.3–1.2)
BUN: 11 mg/dL (ref 6–20)
BUN: 12 mg/dL (ref 6–20)
CHLORIDE: 104 mmol/L (ref 101–111)
CO2: 24 mmol/L (ref 22–32)
CO2: 22 mmol/L (ref 22–32)
Calcium: 8.6 mg/dL — ABNORMAL LOW (ref 8.9–10.3)
Calcium: 8.2 mg/dL — ABNORMAL LOW (ref 8.9–10.3)
Chloride: 107 mmol/L (ref 101–111)
Creatinine, Ser: 1.02 mg/dL — ABNORMAL HIGH (ref 0.44–1.00)
Creatinine, Ser: 1.11 mg/dL — ABNORMAL HIGH (ref 0.44–1.00)
GFR calc Af Amer: >60 mL/min (ref >60)
GFR calc Af Amer: 54 mL/min — ABNORMAL LOW (ref >60)
GFR calc non Af Amer: 52 mL/min — ABNORMAL LOW (ref >60)
GFR, EST NON AFRICAN AMERICAN: 47 mL/min — AB (ref >60)
Glucose, Bld: 136 mg/dL — ABNORMAL HIGH (ref 65–99)
Glucose, Bld: 183 mg/dL — ABNORMAL HIGH (ref 65–99)
POTASSIUM: 3.4 mmol/L — AB (ref 3.5–5.1)
POTASSIUM: 3.2 mmol/L — AB (ref 3.5–5.1)
SODIUM: 138 mmol/L (ref 135–145)
Sodium: 136 mmol/L (ref 135–145)
TOTAL PROTEIN: 6.5 g/dL (ref 6.5–8.1)
TOTAL PROTEIN: 5.6 g/dL — AB (ref 6.5–8.1)

## 2014-11-09 LAB — PROTIME-INR
INR: >10 (ref 0.00–1.49)
Prothrombin Time: >90 seconds — ABNORMAL HIGH (ref 11.6–15.2)

## 2014-11-09 LAB — LACTIC ACID, PLASMA
Lactic Acid, Venous: 3.5 mmol/L (ref 0.5–2.0)
Lactic Acid, Venous: 2.6 mmol/L (ref 0.5–2.0)
Lactic Acid, Venous: 1.8 mmol/L (ref 0.5–2.0)

## 2014-11-09 LAB — APTT

## 2014-11-09 LAB — PHOSPHORUS: PHOSPHORUS: 1.5 mg/dL — AB (ref 2.5–4.6)

## 2014-11-09 LAB — PROCALCITONIN: PROCALCITONIN: 0.5 ng/mL

## 2014-11-09 LAB — TROPONIN I: Troponin I: <0.03 ng/mL (ref <0.031)

## 2014-11-09 LAB — MAGNESIUM: MAGNESIUM: 1.6 mg/dL — AB (ref 1.7–2.4)

## 2014-11-09 LAB — TSH: TSH: 1.008 u[IU]/mL (ref 0.350–4.500)

## 2014-11-09 MED ORDER — ONDANSETRON HCL 4 MG/2ML IJ SOLN: 4.0000 mg | Freq: Four times a day (QID) | INTRAMUSCULAR | Status: DC | PRN

## 2014-11-09 MED ORDER — RIVAROXABAN 20 MG PO TABS
20.0000 mg | ORAL_TABLET | Freq: Every day | ORAL | Status: DC
  Administered 2014-11-09 – 2014-11-10 (×2): 20 mg via ORAL
  Filled 2014-11-09 (×3): qty 1

## 2014-11-09 MED ORDER — LORAZEPAM 2 MG/ML IJ SOLN
1.0000 mg | Freq: Once | INTRAMUSCULAR | Status: AC
  Administered 2014-11-09: 1 mg via INTRAVENOUS
  Filled 2014-11-09: qty 1

## 2014-11-09 MED ORDER — SODIUM CHLORIDE 0.9 % IV BOLUS (SEPSIS)
1000.0000 mL | Freq: Once | INTRAVENOUS | Status: AC
  Administered 2014-11-09: 1000 mL via INTRAVENOUS

## 2014-11-09 MED ORDER — ACETAMINOPHEN 650 MG RE SUPP: 650.0000 mg | Freq: Four times a day (QID) | RECTAL | Status: DC | PRN

## 2014-11-09 MED ORDER — DEXTROSE 5 % IV SOLN
2.0000 g | Freq: Once | INTRAVENOUS | Status: AC
  Administered 2014-11-09: 2 g via INTRAVENOUS
  Filled 2014-11-09: qty 2

## 2014-11-09 MED ORDER — PIPERACILLIN-TAZOBACTAM 3.375 G IVPB
3.3750 g | Freq: Three times a day (TID) | INTRAVENOUS | Status: DC
  Administered 2014-11-09 – 2014-11-11 (×5): 3.375 g via INTRAVENOUS
  Filled 2014-11-09 (×8): qty 50

## 2014-11-09 MED ORDER — POTASSIUM CHLORIDE CRYS ER 20 MEQ PO TBCR
20.0000 meq | EXTENDED_RELEASE_TABLET | Freq: Once | ORAL | Status: AC
  Administered 2014-11-09: 20 meq via ORAL
  Filled 2014-11-09: qty 1

## 2014-11-09 MED ORDER — VANCOMYCIN HCL IN DEXTROSE 1-5 GM/200ML-% IV SOLN
1000.0000 mg | Freq: Once | INTRAVENOUS | Status: DC
  Filled 2014-11-09: qty 200

## 2014-11-09 MED ORDER — IBUPROFEN 200 MG PO TABS
200.0000 mg | ORAL_TABLET | Freq: Once | ORAL | Status: AC
  Administered 2014-11-09: 200 mg via ORAL
  Filled 2014-11-09: qty 1

## 2014-11-09 MED ORDER — ONDANSETRON HCL 4 MG PO TABS: 4.0000 mg | ORAL_TABLET | Freq: Four times a day (QID) | ORAL | Status: DC | PRN

## 2014-11-09 MED ORDER — CALCIUM CARBONATE-VITAMIN D 500-200 MG-UNIT PO TABS
1.0000 | ORAL_TABLET | Freq: Every day | ORAL | Status: DC
  Administered 2014-11-09 – 2014-11-11 (×3): 1 via ORAL
  Filled 2014-11-09 (×3): qty 1

## 2014-11-09 MED ORDER — POTASSIUM PHOSPHATES 15 MMOLE/5ML IV SOLN
30.0000 meq | Freq: Once | INTRAVENOUS | Status: AC
  Administered 2014-11-09: 30 meq via INTRAVENOUS
  Filled 2014-11-09: qty 6.82

## 2014-11-09 MED ORDER — VANCOMYCIN HCL IN DEXTROSE 750-5 MG/150ML-% IV SOLN
750.0000 mg | Freq: Two times a day (BID) | INTRAVENOUS | Status: DC
  Filled 2014-11-09 (×2): qty 150

## 2014-11-09 MED ORDER — ACETAMINOPHEN 325 MG PO TABS
650.0000 mg | ORAL_TABLET | Freq: Once | ORAL | Status: AC | PRN
  Administered 2014-11-09: 650 mg via ORAL
  Filled 2014-11-09: qty 2

## 2014-11-09 MED ORDER — MAGNESIUM SULFATE IN D5W 10-5 MG/ML-% IV SOLN
1.0000 g | Freq: Once | INTRAVENOUS | Status: AC
  Administered 2014-11-10: 1 g via INTRAVENOUS
  Filled 2014-11-09: qty 100

## 2014-11-09 MED ORDER — ACETAMINOPHEN 325 MG PO TABS
650.0000 mg | ORAL_TABLET | Freq: Four times a day (QID) | ORAL | Status: DC | PRN
  Administered 2014-11-09: 650 mg via ORAL
  Filled 2014-11-09: qty 2

## 2014-11-09 MED ORDER — PRAVASTATIN SODIUM 40 MG PO TABS
80.0000 mg | ORAL_TABLET | Freq: Every day | ORAL | Status: DC
  Administered 2014-11-09 – 2014-11-11 (×3): 80 mg via ORAL
  Filled 2014-11-09 (×3): qty 2

## 2014-11-09 MED ORDER — PIPERACILLIN-TAZOBACTAM 3.375 G IVPB 30 MIN
3.3750 g | Freq: Once | INTRAVENOUS | Status: DC
  Filled 2014-11-09: qty 50

## 2014-11-09 MED ORDER — SODIUM CHLORIDE 0.9 % IV SOLN
INTRAVENOUS | Status: DC
  Administered 2014-11-09: 17:00:00 via INTRAVENOUS

## 2014-11-09 MED ORDER — CALCIUM CARBONATE 600 MG PO TABS: 600.0000 mg | ORAL_TABLET | Freq: Every day | ORAL | Status: DC

## 2014-11-09 MED ORDER — DEXTROSE 5 % IV SOLN: 2.0000 g | INTRAVENOUS | Status: DC

## 2014-11-09 MED ORDER — SODIUM CHLORIDE 0.9 % IV BOLUS (SEPSIS)
500.0000 mL | Freq: Once | INTRAVENOUS | Status: AC
  Administered 2014-11-10: 500 mL via INTRAVENOUS

## 2014-11-09 MED ORDER — PIPERACILLIN-TAZOBACTAM 3.375 G IVPB 30 MIN
3.3750 g | Freq: Once | INTRAVENOUS | Status: AC
  Administered 2014-11-09: 3.375 g via INTRAVENOUS
  Filled 2014-11-09 (×2): qty 50

## 2014-11-09 MED ORDER — VANCOMYCIN HCL IN DEXTROSE 1-5 GM/200ML-% IV SOLN
1000.0000 mg | Freq: Once | INTRAVENOUS | Status: AC
  Administered 2014-11-09: 1000 mg via INTRAVENOUS

## 2014-11-09 MED ORDER — METOPROLOL SUCCINATE ER 100 MG PO TB24
100.0000 mg | ORAL_TABLET | Freq: Every day | ORAL | Status: DC
  Filled 2014-11-09: qty 1

## 2014-11-09 MED ORDER — HYDROXYUREA 500 MG PO CAPS
500.0000 mg | ORAL_CAPSULE | Freq: Every day | ORAL | Status: DC
  Administered 2014-11-09 – 2014-11-11 (×3): 500 mg via ORAL
  Filled 2014-11-09 (×5): qty 1

## 2014-11-09 MED ORDER — ACETAMINOPHEN 500 MG PO TABS
1000.0000 mg | ORAL_TABLET | Freq: Once | ORAL | Status: DC
Start: 2014-11-09 — End: 2014-11-09
  Filled 2014-11-09: qty 2

## 2014-11-09 NOTE — Progress Notes (Signed)
CRITICAL VALUE ALERT  Critical value received:  Lactic Acid = 3.5  Date of notification:  11-09-2014  Time of notification:  17:31  Critical value read back:Yes.    Nurse who received alert:  Genelle Bal, RN  MD notified (1st page):  Charlies Silvers  Time of first page:  17:33  MD notified (2nd page): Charlies Silvers  Time of second page:  18:02  Responding MD:  Charlies Silvers  Time MD responded:  18:20

## 2014-11-09 NOTE — Progress Notes (Addendum)
ANTIBIOTIC CONSULT NOTE - INITIAL  Pharmacy Consult for rocephin Indication: UTI  No Known Allergies  Patient Measurements: Height: 5\' 5"  (165.1 cm) Weight: 219 lb (99.338 kg) IBW/kg (Calculated) : 57   Vital Signs: Temp: 103.2 F (39.6 C) (09/26 1026) Temp Source: Rectal (09/26 1026) BP: 133/70 mmHg (09/26 1045) Pulse Rate: 96 (09/26 1045) Intake/Output from previous day:   Intake/Output from this shift:    Labs:  Recent Labs  11/09/14 1035  WBC 13.9*  HGB 15.1*  PLT 299   CrCl cannot be calculated (Patient has no serum creatinine result on file.). No results for input(s): VANCOTROUGH, VANCOPEAK, VANCORANDOM, GENTTROUGH, GENTPEAK, GENTRANDOM, TOBRATROUGH, TOBRAPEAK, TOBRARND, AMIKACINPEAK, AMIKACINTROU, AMIKACIN in the last 72 hours.   Microbiology: No results found for this or any previous visit (from the past 720 hour(s)).  Medical History: Past Medical History  Diagnosis Date  . Essential thrombocytosis   . Breast cancer   . HX: breast cancer 06/08/2011  . Essential thrombocytosis 06/08/2011  . Hypertension     Assessment: 79 yo F in ED with near syncope and fever and chills.  Temp 103.2, lactate 2.3. WBC 13.9; wt 99.2 kg.  Pharmacy ordered to dose rocephin for UTI - will dose to cover sepsis as well.  Plan:  Rocephin 2 gm IV q24 Pharmacy to sign off Thank you  Eudelia Bunch, Pharm.D. [phone] 11/09/2014 11:19 AM  To change Rocephin to vancomycin/zosyn per pharmacy for sepsis.  SHe got Rocephin 2 gm at 1149 am.  Plan: - zosyn 3.375 gm IV x 1 dose over 30 minutes (first dose scheduled for 1600 due to pt just got Rocephin) then zosyn 3.375 gm IV q8h, infuse each dose over 4 hours -Vancomycin 1 gm  IV  X 1 then vancomycin 750 mg IV q12h -f/u renal fxn, wbc, temp, culture data -vanc levels as needed  Eudelia Bunch, Pharm.D. [phone] 11/09/2014 1:53 PM

## 2014-11-09 NOTE — Progress Notes (Signed)
Dr. Charlies Silvers returned call.  Pt is on IV antibiotics, hold metoprolol this dose, give ibuprophen 200 mg po x 1 dose.

## 2014-11-09 NOTE — ED Notes (Signed)
Per EMS: Pt had syncopal episode approximately 8:50 this morning.  Pt reports dizziness, denies LOC, recent illness.  Pt feel warm to touch, and reports having chills approx 3am this morning.  Pt temp was 97 orally.  Pt takes lasix, no changes to presciption.  Pt reports less PO intake than normal.  PT denies heart issues, but EMS EKG shows RBBB, and possible inferior st depression. No caridac signs/symptoms.  Pt denies cardiac history, but is on cardiac meds.  Orthostatic vitals: standing 128/80. Sitting 152/100 left arm.  Pulse 89, O2 97% RA, RR 16.  CBG 162. Lat BP 134/78.

## 2014-11-09 NOTE — ED Provider Notes (Signed)
CSN: [id number]     Arrival date & time 11/09/14  1023 History   First MD Initiated Contact with Patient 11/09/14 1027     Chief Complaint  Patient presents with  . Near Syncope  . Fever     (Consider location/radiation/quality/duration/timing/severity/associated sxs/prior Treatment) Patient is a 79 y.o. female presenting with near-syncope and fever. The history is provided by the patient.  Near Syncope This is a new problem. The current episode started 3 to 5 hours ago. The problem occurs constantly. The problem has not changed since onset.Pertinent negatives include no chest pain, no abdominal pain and no shortness of breath. Nothing aggravates the symptoms. Nothing relieves the symptoms.  Fever Associated symptoms: chills   Associated symptoms: no chest pain, no cough, no diarrhea, no nausea and no vomiting     Past Medical History  Diagnosis Date  . Essential thrombocytosis   . Breast cancer   . HX: breast cancer 06/08/2011  . Essential thrombocytosis 06/08/2011  . Hypertension    Past Surgical History  Procedure Laterality Date  . Mastectomy     Family History  Problem Relation Age of Onset  . Heart disease Mother   . Heart attack Mother   . Cancer Father   . Stroke Brother   . Heart disease Brother   . Diabetes Brother    Social History  Substance Use Topics  . Smoking status: Never Smoker   . Smokeless tobacco: Not on file  . Alcohol Use: No   OB History    No data available     Review of Systems  Constitutional: Positive for fever and chills.  Respiratory: Negative for cough and shortness of breath.   Cardiovascular: Positive for near-syncope. Negative for chest pain and leg swelling.  Gastrointestinal: Negative for nausea, vomiting, abdominal pain and diarrhea.  Genitourinary: Positive for frequency.  All other systems reviewed and are negative.     Allergies  Review of patient's allergies indicates no known allergies.  Home Medications   Prior  to Admission medications   Medication Sig Start Date End Date Taking? Authorizing Provider  calcium carbonate (OS-CAL) 600 MG TABS Take 600 mg by mouth daily.    Historical Provider, MD  furosemide (LASIX) 20 MG tablet Take 20 mg by mouth 2 (two) times a week.    Historical Provider, MD  hydroxyurea (HYDREA) 500 MG capsule TAKE 1 CAPSULE (500 MG TOTAL) BY MOUTH DAILY. MAY TAKE WITH FOOD TO MINIMIZE GI SIDE EFFECTS. 09/14/14   Nicholas Lose, MD  metoprolol succinate (TOPROL-XL) 100 MG 24 hr tablet Take 100 mg by mouth daily. Take with or immediately following a meal.    Historical Provider, MD  pravastatin (PRAVACHOL) 80 MG tablet Take 1 tablet by mouth daily. 12/23/13   Historical Provider, MD  rivaroxaban (XARELTO) 20 MG TABS tablet Take 20 mg by mouth daily with supper.    Historical Provider, MD  telmisartan-hydrochlorothiazide (MICARDIS HCT) 80-25 MG per tablet Take 1 tablet by mouth daily. 08/12/14   Historical Provider, MD   BP 134/59 mmHg  Pulse 89  Temp(Src) 103.2 F (39.6 C) (Rectal)  Resp 23  Ht 5\' 5"  (1.651 m)  Wt 219 lb (99.338 kg)  BMI 36.44 kg/m2  SpO2 95% Physical Exam  Constitutional: She is oriented to person, place, and time. She appears well-developed and well-nourished. No distress.  HENT:  Head: Normocephalic and atraumatic.  Mouth/Throat: Oropharynx is clear and moist.  Eyes: EOM are normal. Pupils are equal, round, and reactive to  light.  Neck: Normal range of motion. Neck supple.  Cardiovascular: Normal rate and regular rhythm.  Exam reveals no friction rub.   No murmur heard. Pulmonary/Chest: Effort normal and breath sounds normal. No respiratory distress. She has no wheezes. She has no rales.  Abdominal: Soft. She exhibits no distension. There is no tenderness. There is no rebound.  Musculoskeletal: Normal range of motion. She exhibits no edema.  Neurological: She is alert and oriented to person, place, and time.  Skin: She is not diaphoretic.  Nursing note and  vitals reviewed.   ED Course  Procedures (including critical care time) Labs Review Labs Reviewed  CBC WITH DIFFERENTIAL/PLATELET - Abnormal; Notable for the following:    WBC 13.9 (*)    Hemoglobin 15.1 (*)    Neutro Abs 12.3 (*)    Lymphs Abs 0.5 (*)    Monocytes Absolute 1.1 (*)    All other components within normal limits  COMPREHENSIVE METABOLIC PANEL - Abnormal; Notable for the following:    Potassium 3.4 (*)    Glucose, Bld 136 (*)    Creatinine, Ser 1.02 (*)    Calcium 8.6 (*)    Albumin 3.1 (*)    GFR calc non Af Amer 52 (*)    All other components within normal limits  I-STAT CG4 LACTIC ACID, ED - Abnormal; Notable for the following:    Lactic Acid, Venous 2.32 (*)    All other components within normal limits  URINE CULTURE  CULTURE, BLOOD (ROUTINE X 2)  CULTURE, BLOOD (ROUTINE X 2)  URINALYSIS, ROUTINE W REFLEX MICROSCOPIC (NOT AT [facility])  PROCALCITONIN  TROPONIN I  LACTIC ACID, PLASMA  LACTIC ACID, PLASMA  TROPONIN I  TROPONIN I  I-STAT CG4 LACTIC ACID, ED    Imaging Review Dg Chest 2 View  11/09/2014   CLINICAL DATA:  Fever.  Headache and dizziness.  EXAM: CHEST  2 VIEW  COMPARISON:  09/26/2008  FINDINGS: Heart size and pulmonary vascularity are normal and the lungs are clear. No effusions. Previous right mastectomy. No acute osseous abnormality.  IMPRESSION: No acute disease in the chest.   Electronically Signed   By: Lorriane Shire M.D.   On: 11/09/2014 12:35   Ct Head Wo Contrast  11/09/2014   CLINICAL DATA:  Pain following fall.  Dizziness.  EXAM: CT HEAD WITHOUT CONTRAST  TECHNIQUE: Contiguous axial images were obtained from the base of the skull through the vertex without intravenous contrast.  COMPARISON:  October 20, 2014  FINDINGS: The ventricles are normal in size and configuration. There is no intracranial mass, hemorrhage, extra-axial fluid collection, or midline shift. There is minimal small vessel disease in the centra semiovale bilaterally.  Gray-white compartments elsewhere normal. No acute infarct evident. The bony calvarium appears intact. The mastoid air cells are clear. There is mild mucosal thickening of several ethmoid air cells bilaterally.  IMPRESSION: Minimal periventricular small vessel disease. No acute infarct evident. No hemorrhage or mass effect. No extra-axial fluid collection. Mild ethmoid sinus disease bilaterally.   Electronically Signed   By: Lowella Grip III M.D.   On: 11/09/2014 11:38   I have personally reviewed and evaluated these images and lab results as part of my medical decision-making.   EKG Interpretation None      MDM   Final diagnoses:  Fever, unspecified fever cause  Near syncope    79 year old female here with near syncope, fall, and chills. Chills began earlier this morning. She had a near syncopal event when she  got up to urinate. She was incontinent of urine culture. Spell it hit her head on the bathtub. She's feeling well here. She denies any headache, blurry vision, cough, chest pain, shortness of breath, vomiting, abdominal pain, diarrhea. She denies any dysuria. Concern for possible infection as her initial temperature here is 103.2. Lactate is 2.3. We'll give fluids and check cultures and chest x-ray. Normal urine and chest x-ray. Labs okay. With her weakness, fall, fever, will admit.   Evelina Bucy, MD 11/09/14 [phone]

## 2014-11-09 NOTE — ED Notes (Signed)
Called Xray to retrieve patient.

## 2014-11-09 NOTE — H&P (Addendum)
[facility] History and Physical  Lori Page WFU:[id number] DOB: February 11, 1936 DOA: 11/09/2014  Referring physician: ER physician: Dr. Evelina Bucy  PCP: Horatio Pel, MD  Oncology: Dr. Nicholas Lose   Chief Complaint: near syncope, fever   HPI:  79 year old female with past medical history of hypertension, DVT and PE (on anticoagulation with xarelto), breast cancer s/p mastectomy (in 1980's). She presented to [facility] ED status post fall at home in the bathroom. No reports of chest pain, or shortness of breath prior to the fall. She did feel lightheaded prior to the fall. No loss of consciousness. She was recently seen in ED (10/20/2014) status post fall (she fell while on the back porch trying to climb the stairs and she lost balance). She also had fevers and chills which started this morning, No reports of cough. She did have urinary urgency and frequency and some incontinence but no hematuria or dysuria. No abdominal pain, nausea or vomiting. No diarrhea or constipation.   In ED, BP was 101/56, HR 96, RR 18-27, T max 130.2 F and oxygen saturation 91-98% with [place] oxygen support. Blood work was notable for WBC count 13.9, potassium 3.4 and creatinine of 1.02 (baseline 1.18). No acute intracranial findings on CT head. CXR showed no acute cardiopulmonary process. UA was unremarkable. She was started on vanco and zosyn in ED and admitted for further evaluation of fever, sepsis.    Assessment & Plan    Principal Problem: Sepsis due to undetermined organism / Leukocytosis - Sepsis criteria met on admission with fever of 103.2 F, tachypnea, leukocytosis and lactic acid of 2.32. No obvious source of infection. Possible UTI since pt complained of urinary frequency and urgency however UA did not show leukocytes or nitrites. CXR showed no acute cardiopulmonary findings.  - Sepsis work up initiated. Follow up procalcitonin level. Trend lactic acid level. - Started vanco and zosyn. - Follow up  blood and urine culture results. - She is hemodynamically stable and does not require pressor support. - Monitor on telemetry.   Active Problems: Breast cancer of lower-inner quadrant of right female breast - Stage I right breast cancer diagnosed in 1984 - Status post mastectomy, adjuvant chemotherapy and adjuvant antiestrogen therapy.  - Mammogram 05/22/2014 No abnormalities.   Personal history of venous thrombosis and embolism - On full dose anticoagulation with xarelto   Near syncope / Fall - Probably vasovagal, from sepsis, low blood pressure - CT head showed no acute intracranial findings  - Now says she feels better - Will cycle cardiac enzymes - Monitor on telemetry - Will need PT eval for safe discharge plan    Essential hypertension - BP 119/77 so BP meds on hold   Dyslipidemia - Resume Pravachol    Chronic diastolic CHF - Last 2 D ECHO in 01/2014 with grade 1 diastolic dysfunction, normal EF - Compensated - Lasix, metoprolol on hold due to soft blood pressure   Hypokalemia - Secondary to lasix - Supplemented   CKD (chronic kidney disease) stage 2, GFR 60-89 ml/min - Creatinine in 08/2010 was 1.18 and on this admission within baseline values (1.02)  Essential thrombocytosis - Follows with Dr. Lindi Adie - She is on hydrea   DVT prophylaxis:  - On full dose AC with xarelto    Radiological Exams on Admission: Dg Chest 2 View 11/09/2014    No acute disease in the chest.   Electronically Signed   By: Lorriane Shire M.D.   On: 11/09/2014 12:35   Ct Head  Wo Contrast 11/09/2014   Minimal periventricular small vessel disease. No acute infarct evident. No hemorrhage or mass effect. No extra-axial fluid collection. Mild ethmoid sinus disease bilaterally.   Electronically Signed   By: Lowella Grip III M.D.   On: 11/09/2014 11:38    EKG: I have personally reviewed EKG. EKG shows sinus rhythm  Code Status: Full Family Communication: Plan of care discussed with the  patient and her daughter at the bedside  Disposition Plan: Admit for further evaluation, telemetry admission due to sepsis   Leisa Lenz, MD  [facility] Pager [phone]  Time spent in minutes: 75 minutes  Review of Systems:  Constitutional: Negative for diaphoresis. Positive for fever, chills HENT: Negative for hearing loss, ear pain, nosebleeds, congestion, sore throat, neck pain, tinnitus and ear discharge.   Eyes: Negative for blurred vision, double vision, photophobia, pain, discharge and redness.  Respiratory: Negative for cough, hemoptysis, sputum production, shortness of breath, wheezing and stridor.   Cardiovascular: Negative for chest pain, palpitations, orthopnea, claudication and leg swelling.  Gastrointestinal: Negative for nausea, vomiting and abdominal pain. Negative for heartburn, constipation, blood in stool and melena.  Genitourinary: Negative for dysuria, urgency, frequency, hematuria and flank pain.  Musculoskeletal: Negative for myalgias, back pain, joint pain and positive for falls.  Skin: Negative for itching and rash.  Neurological: Negative for dizziness and weakness. Negative for tingling, tremors, sensory change, speech change, focal weakness, loss of consciousness and headaches.  Endo/Heme/Allergies: Negative for environmental allergies and polydipsia. Does not bruise/bleed easily.  Psychiatric/Behavioral: Negative for suicidal ideas. The patient is not nervous/anxious.      Past Medical History  Diagnosis Date  . Essential thrombocytosis   . Breast cancer   . HX: breast cancer 06/08/2011  . Essential thrombocytosis 06/08/2011  . Hypertension    Past Surgical History  Procedure Laterality Date  . Mastectomy     Social History:  reports that she has never smoked. She does not have any smokeless tobacco history on file. She reports that she does not drink alcohol. Her drug history is not on file.  No Known Allergies  Family History:  Family  History  Problem Relation Age of Onset  . Heart disease Mother   . Heart attack Mother   . Cancer Father   . Stroke Brother   . Heart disease Brother   . Diabetes Brother      Prior to Admission medications   Medication Sig Start Date End Date Taking? Authorizing Provider  calcium carbonate (OS-CAL) 600 MG TABS Take 600 mg by mouth daily.   Yes Historical Provider, MD  furosemide (LASIX) 20 MG tablet Take 20 mg by mouth 2 (two) times a week.   Yes Historical Provider, MD  hydroxyurea (HYDREA) 500 MG capsule TAKE 1 CAPSULE (500 MG TOTAL) BY MOUTH DAILY. MAY TAKE WITH FOOD TO MINIMIZE GI SIDE EFFECTS. 09/14/14  Yes Nicholas Lose, MD  metoprolol succinate (TOPROL-XL) 100 MG 24 hr tablet Take 100 mg by mouth daily. Take with or immediately following a meal.   Yes Historical Provider, MD  pravastatin (PRAVACHOL) 80 MG tablet Take 1 tablet by mouth daily. 12/23/13  Yes Historical Provider, MD  rivaroxaban (XARELTO) 20 MG TABS tablet Take 20 mg by mouth daily with supper.   Yes Historical Provider, MD  telmisartan-hydrochlorothiazide (MICARDIS HCT) 80-25 MG per tablet Take 1 tablet by mouth daily. 08/12/14  Yes Historical Provider, MD   Physical Exam: Filed Vitals:   11/09/14 1200 11/09/14 1310 11/09/14 1315 11/09/14  1330  BP: 117/59 110/57 109/57 104/58  Pulse: 90 86 85 85  Temp:      TempSrc:      Resp: $Remo'23 22 18 26  'rugDj$ Height:      Weight:      SpO2: 93% 94% 95% 94%    Physical Exam  Constitutional: Appears well-developed and well-nourished. No distress.  HENT: Normocephalic. No tonsillar erythema or exudates Eyes: Conjunctivae are normal. No scleral icterus.  Neck: Normal ROM. Neck supple. No JVD. No tracheal deviation. No thyromegaly.  CVS: tachycardic, S1/S2 appreciated .  Pulmonary: Effort and breath sounds normal, no stridor, rhonchi, wheezes, rales.  Abdominal: Soft. BS +,  no distension, tenderness, rebound or guarding.  Musculoskeletal: Normal range of motion. No edema and no  tenderness.  Lymphadenopathy: No lymphadenopathy noted, cervical, inguinal. Neuro: Alert. Normal reflexes, muscle tone coordination. No focal neurologic deficits. Skin: Skin is warm and dry. No rash noted.  No erythema. No pallor.  Psychiatric: Normal mood and affect. Behavior, judgment, thought content normal.   Labs on Admission:  Basic Metabolic Panel:  Recent Labs Lab 11/09/14 1211  NA 138  K 3.4*  CL 104  CO2 24  GLUCOSE 136*  BUN 11  CREATININE 1.02*  CALCIUM 8.6*   Liver Function Tests:  Recent Labs Lab 11/09/14 1211  AST 27  ALT 16  ALKPHOS 61  BILITOT 0.6  PROT 6.5  ALBUMIN 3.1*   No results for input(s): LIPASE, AMYLASE in the last 168 hours. No results for input(s): AMMONIA in the last 168 hours. CBC:  Recent Labs Lab 11/09/14 1035  WBC 13.9*  NEUTROABS 12.3*  HGB 15.1*  HCT 45.0  MCV 94.7  PLT 299   Cardiac Enzymes: No results for input(s): CKTOTAL, CKMB, CKMBINDEX, TROPONINI in the last 168 hours. BNP: Invalid input(s): POCBNP CBG: No results for input(s): GLUCAP in the last 168 hours.  If 7PM-7AM, please contact night-coverage [email or web address] Password [email or web address] 11/09/2014, 1:44 PM

## 2014-11-09 NOTE — Progress Notes (Addendum)
Pt temp 103.1 tylenol given @ 1 hr ago.  Lactic acid 3.5  Continue current sepsis management with abx ordered. May give ibuprofen for fever if tylenol not helping.

## 2014-11-09 NOTE — Progress Notes (Signed)
Pt admitted to room 6E11 from Ed. Pt alert and oriented.  VSS. Denies pain.  Pt bp 114/66 hr 87 paged Dr. Charlies Silvers. Pt to get 100 mg metoprolol ER.  Pt had syncopal episode at home. Does Dr. Charlies Silvers want to hold today's dose.

## 2014-11-09 NOTE — ED Notes (Signed)
Patient transported to CT 

## 2014-11-09 NOTE — ED Notes (Signed)
Pt reports having chills and light headed at about 3am this morning.  Pt got light headed and fell at approximately 8:30am.  Pt hit her head on the tub when she fel. Pt denies loss of consciousness.

## 2014-11-10 ENCOUNTER — Inpatient Hospital Stay (HOSPITAL_COMMUNITY): Payer: Medicare Other

## 2014-11-10 DIAGNOSIS — D473 Essential (hemorrhagic) thrombocythemia: Secondary | ICD-10-CM

## 2014-11-10 DIAGNOSIS — I5032 Chronic diastolic (congestive) heart failure: Secondary | ICD-10-CM

## 2014-11-10 DIAGNOSIS — R55 Syncope and collapse: Secondary | ICD-10-CM

## 2014-11-10 LAB — COMPREHENSIVE METABOLIC PANEL
ALT: 14 U/L (ref 14–54)
AST: 25 U/L (ref 15–41)
Albumin: 2.6 g/dL — ABNORMAL LOW (ref 3.5–5.0)
Alkaline Phosphatase: 55 U/L (ref 38–126)
Anion gap: 10 (ref 5–15)
BUN: 13 mg/dL (ref 6–20)
CHLORIDE: 104 mmol/L (ref 101–111)
CO2: 23 mmol/L (ref 22–32)
CREATININE: 1.19 mg/dL — AB (ref 0.44–1.00)
Calcium: 8.1 mg/dL — ABNORMAL LOW (ref 8.9–10.3)
GFR calc non Af Amer: 43 mL/min — ABNORMAL LOW (ref >60)
GFR, EST AFRICAN AMERICAN: 50 mL/min — AB (ref >60)
Glucose, Bld: 132 mg/dL — ABNORMAL HIGH (ref 65–99)
POTASSIUM: 3.1 mmol/L — AB (ref 3.5–5.1)
SODIUM: 137 mmol/L (ref 135–145)
Total Bilirubin: 0.9 mg/dL (ref 0.3–1.2)
Total Protein: 5.6 g/dL — ABNORMAL LOW (ref 6.5–8.1)

## 2014-11-10 LAB — CBC
HEMATOCRIT: 38 % (ref 36.0–46.0)
Hemoglobin: 12.8 g/dL (ref 12.0–15.0)
MCH: 31.9 pg (ref 26.0–34.0)
MCHC: 33.7 g/dL (ref 30.0–36.0)
MCV: 94.8 fL (ref 78.0–100.0)
PLATELETS: 263 10*3/uL (ref 150–400)
RBC: 4.01 MIL/uL (ref 3.87–5.11)
RDW: 15.1 % (ref 11.5–15.5)
WBC: 9.3 10*3/uL (ref 4.0–10.5)

## 2014-11-10 LAB — URINE CULTURE: Culture: NO GROWTH

## 2014-11-10 LAB — MAGNESIUM: Magnesium: 2.2 mg/dL (ref 1.7–2.4)

## 2014-11-10 LAB — TROPONIN I

## 2014-11-10 LAB — GLUCOSE, CAPILLARY: Glucose-Capillary: 126 mg/dL — ABNORMAL HIGH (ref 65–99)

## 2014-11-10 LAB — PHOSPHORUS: Phosphorus: 3.4 mg/dL (ref 2.5–4.6)

## 2014-11-10 MED ORDER — VANCOMYCIN HCL IN DEXTROSE 750-5 MG/150ML-% IV SOLN
750.0000 mg | Freq: Two times a day (BID) | INTRAVENOUS | Status: DC
  Administered 2014-11-10 – 2014-11-11 (×2): 750 mg via INTRAVENOUS
  Filled 2014-11-10 (×3): qty 150

## 2014-11-10 MED ORDER — VANCOMYCIN HCL 10 G IV SOLR: 1250.0000 mg | INTRAVENOUS | Status: DC

## 2014-11-10 MED ORDER — SODIUM CHLORIDE 0.9 % IV SOLN
1250.0000 mg | INTRAVENOUS | Status: DC
  Filled 2014-11-10: qty 1250

## 2014-11-10 MED ORDER — POTASSIUM CHLORIDE CRYS ER 20 MEQ PO TBCR
30.0000 meq | EXTENDED_RELEASE_TABLET | Freq: Once | ORAL | Status: AC
  Administered 2014-11-10: 30 meq via ORAL
  Filled 2014-11-10: qty 2

## 2014-11-10 NOTE — Evaluation (Signed)
Physical Therapy Evaluation Patient Details Name: Lori Page MRN: [id number] DOB: [birth year] Today's Date: 11/10/2014   History of Present Illness  79 year old female with past medical history of hypertension, DVT and PE (on anticoagulation with xarelto), breast cancer s/p mastectomy (in 1980's). She presented to [facility] ED status post fall at home in the bathroom. Dx of sepsis.   Clinical Impression  Pt independently walked 200' without an assistive device, no loss of balance. She denied dizziness with walking. She is independent with mobility, no further PT indicated. Encouraged pt to walk in halls 2-3x/day. PT signing off.     Follow Up Recommendations No PT follow up    Equipment Recommendations  None recommended by PT    Recommendations for Other Services       Precautions / Restrictions Precautions Precautions: None Precaution Comments: pt/family deny h/o previous falls Restrictions Weight Bearing Restrictions: No      Mobility  Bed Mobility Overal bed mobility: Independent                Transfers Overall transfer level: Independent                  Ambulation/Gait Ambulation/Gait assistance: Independent Ambulation Distance (Feet): 200 Feet Assistive device: None Gait Pattern/deviations: WFL(Within Functional Limits)   Gait velocity interpretation: at or above normal speed for age/gender General Gait Details: steady, no LOB, pt denied dizziness with walking, HR 94 with walking  Stairs            Wheelchair Mobility    Modified Rankin (Stroke Patients Only)       Balance Overall balance assessment: Independent                                           Pertinent Vitals/Pain Pain Assessment: No/denies pain    Home Living Family/patient expects to be discharged to:: Private residence Living Arrangements: Spouse/significant other Available Help at Discharge: Available PRN/intermittently Type of Home: House Home  Access: Stairs to enter   [employer] of Steps: 2 Home Layout: Two level;Able to live on main level with bedroom/bathroom Home Equipment: None      Prior Function Level of Independence: Independent         Comments: cares for 75 month old grandaughter daily     Hand Dominance        Extremity/Trunk Assessment   Upper Extremity Assessment: Overall WFL for tasks assessed           Lower Extremity Assessment: Overall WFL for tasks assessed      Cervical / Trunk Assessment: Normal  Communication   Communication: No difficulties  Cognition Arousal/Alertness: Awake/alert Behavior During Therapy: WFL for tasks assessed/performed Overall Cognitive Status: Within Functional Limits for tasks assessed                      General Comments      Exercises        Assessment/Plan    PT Assessment Patent does not need any further PT services  PT Diagnosis     PT Problem List    PT Treatment Interventions     PT Goals (Current goals can be found in the Care Plan section) Acute Rehab PT Goals Patient Stated Goal: care for baby grandaughter PT Goal Formulation: All assessment and education complete, DC therapy    Frequency  Barriers to discharge        Co-evaluation               End of Session Equipment Utilized During Treatment: Gait belt Activity Tolerance: Patient tolerated treatment well;No increased pain Patient left: in chair;with call bell/phone within reach;with family/visitor present Nurse Communication: Mobility status         Time: 5686-1683 PT Time Calculation (min) (ACUTE ONLY): 30 min   Charges:   PT Evaluation $Initial PT Evaluation Tier I: 1 Procedure PT Treatments $Gait Training: 8-22 mins   PT G Codes:        Philomena Doheny 11/10/2014, 10:48 AM [phone]

## 2014-11-10 NOTE — Progress Notes (Signed)
PROGRESS NOTE  Lori Page GYK:[id number] DOB: 1935-11-15 DOA: 11/09/2014 PCP: Horatio Pel, MD  HPI/Recap of past 24 hours:  Feeling better, sitting in chair, NAD, husband in room.  Assessment/Plan: Principal Problem:   Sepsis due to undetermined organism Active Problems:   Breast cancer of lower-inner quadrant of right female breast   Personal history of venous thrombosis and embolism   Leukocytosis   Essential hypertension   Dyslipidemia   Hypokalemia   CKD (chronic kidney disease) stage 2, GFR 60-89 ml/min   Thrombocytosis   Chronic diastolic CHF (congestive heart failure)   Near syncope   Fall  Sepsis due to undetermined organism / Leukocytosis - Sepsis criteria met on admission with fever of 103.2 F, tachypnea, leukocytosis and lactic acid of 2.32. No obvious source of infection. Possible UTI since pt complained of urinary frequency and urgency however UA did not show leukocytes or nitrites. CXR showed no acute cardiopulmonary findings.  - Sepsis work up initiated. Follow up procalcitonin level. lactic acid normalized - continue vanco and zosyn. - Follow up blood and urine culture results. - She is hemodynamically stable and does not require pressor support. - Monitor on telemetry.   Active Problems: Breast cancer of lower-inner quadrant of right female breast - Stage I right breast cancer diagnosed in 1984 - Status post mastectomy, adjuvant chemotherapy and adjuvant antiestrogen therapy.  - Mammogram 05/22/2014 No abnormalities.   Personal history of venous thrombosis and embolism - On full dose anticoagulation with xarelto , monitor renal funciton  Near syncope / Fall - Probably vasovagal, from sepsis, low blood pressure - CT head showed no acute intracranial findings  - negative cardiac enzymes, echo chronic grade i diastolic dysfuntion - Monitor on telemetry -  PT eval for safe discharge plan   Essential hypertension - BP 119/77 so BP  meds on hold  -likely able to restart bp meds 9/28  Dyslipidemia - Resume Pravachol   Chronic diastolic CHF - Last 2 D ECHO in 01/2014 with grade 1 diastolic dysfunction, normal EF - Compensated - Lasix, metoprolol on hold due to soft blood pressure , likely able to restart on 9/28  Hypokalemia - Secondary to lasix - Supplemented   CKD (chronic kidney disease) stage 2, GFR 60-89 ml/min - Creatinine in 08/2010 was 1.18 and on this admission within baseline values (1.02)  Essential thrombocytosis - Follows with Dr. Lindi Adie - She is on hydrea , lft wnl, does has mild chronic lower extremity edema  DVT prophylaxis:  - On full dose AC with xarelto   Code Status: full  Family Communication: patient and husband  Disposition Plan: home in 1-2 days   Consultants:  none  Procedures:  none  Antibiotics:  Vanc/zosyn from admission   Objective: BP 128/48 mmHg  Pulse 82  Temp(Src) 98.1 F (36.7 C) (Oral)  Resp 18  Ht _0  (1.651 m)  Wt 223 lb 8.7 oz (101.4 kg)  BMI 37.20 kg/m2  SpO2 98%  Intake/Output Summary (Last 24 hours) at 11/10/14 2005 Last data filed at 11/10/14 1847  Gross per 24 hour  Intake    720 ml  Output    225 ml  Net    495 ml   Filed Weights   11/09/14 1026 11/10/14 0539  Weight: 219 lb (99.338 kg) 223 lb 8.7 oz (101.4 kg)    Exam:   General:  NAD  Cardiovascular: RRR  Respiratory: CTABL  Abdomen: Soft/ND/NT, positive BS  Musculoskeletal: trace ankle Edema  Neuro: aaox3  Data Reviewed: Basic Metabolic Panel:  Recent Labs Lab 11/09/14 1211 11/09/14 1939 11/10/14 0153  NA 138 136 137  K 3.4* 3.2* 3.1*  CL 104 107 104  CO2 _0 GLUCOSE 136* 183* 132*  BUN _1 CREATININE 1.02* 1.11* 1.19*  CALCIUM 8.6* 8.2* 8.1*  MG  --  1.6* 2.2  PHOS  --  1.5* 3.4   Liver Function Tests:  Recent Labs Lab 11/09/14 1211 11/09/14 1939 11/10/14 0153  AST _2 ALT _3 ALKPHOS 61 55 55  BILITOT 0.6 0.6  0.9  PROT 6.5 5.6* 5.6*  ALBUMIN 3.1* 2.7* 2.6*   No results for input(s): LIPASE, AMYLASE in the last 168 hours. No results for input(s): AMMONIA in the last 168 hours. CBC:  Recent Labs Lab 11/09/14 1035 11/09/14 1939 11/10/14 0153  WBC 13.9* 11.2* 9.3  NEUTROABS 12.3* 10.0*  --   HGB 15.1* 12.9 12.8  HCT 45.0 38.6 38.0  MCV 94.7 94.1 94.8  PLT 299 276 263   Cardiac Enzymes:    Recent Labs Lab 11/09/14 1406 11/09/14 1939 11/10/14 0153  TROPONINI <0.03 <0.03 <0.03   BNP (last 3 results) No results for input(s): BNP in the last 8760 hours.  ProBNP (last 3 results) No results for input(s): PROBNP in the last 8760 hours.  CBG:  Recent Labs Lab 11/10/14 0738  GLUCAP 126*    Recent Results (from the past 240 hour(s))  Urine culture     Status: None   Collection Time: 11/09/14 10:51 AM  Result Value Ref Range Status   Specimen Description URINE, CATHETERIZED  Final   Special Requests NONE  Final   Culture NO GROWTH 1 DAY  Final   Report Status 11/10/2014 FINAL  Final  Blood culture (routine x 2)     Status: None (Preliminary result)   Collection Time: 11/09/14 11:43 AM  Result Value Ref Range Status   Specimen Description BLOOD RIGHT ARM  Final   Special Requests BOTTLES DRAWN AEROBIC AND ANAEROBIC 10CC  Final   Culture NO GROWTH 1 DAY  Final   Report Status PENDING  Incomplete  Blood culture (routine x 2)     Status: None (Preliminary result)   Collection Time: 11/09/14 11:48 AM  Result Value Ref Range Status   Specimen Description BLOOD RIGHT HAND  Final   Special Requests BOTTLES DRAWN AEROBIC AND ANAEROBIC 5CC  Final   Culture NO GROWTH 1 DAY  Final   Report Status PENDING  Incomplete     Studies: No results found.  Scheduled Meds: . calcium-vitamin D  1 tablet Oral Q breakfast  . hydroxyurea  500 mg Oral Daily  . piperacillin-tazobactam (ZOSYN)  IV  3.375 g Intravenous Q8H  . pravastatin  80 mg Oral Daily  . rivaroxaban  20 mg Oral Q supper    . vancomycin  750 mg Intravenous Q12H    Continuous Infusions:    Time spent: 51mns  Xu,Fang MD, PhD  [facility] Pager [phone] If 7PM-7AM, please contact night-coverage at [email or web address], password [email or web address]9/27/2016, 8:05 PM  LOS: 1 day

## 2014-11-10 NOTE — Progress Notes (Signed)
  Echocardiogram 2D Echocardiogram has been performed.  GREGORY, ANGELA 11/10/2014, 10:12 AM

## 2014-11-11 LAB — CBC WITH DIFFERENTIAL/PLATELET
BASOS PCT: 1 %
Basophils Absolute: 0 10*3/uL (ref 0.0–0.1)
EOS ABS: 0.1 10*3/uL (ref 0.0–0.7)
Eosinophils Relative: 2 %
HCT: 39.1 % (ref 36.0–46.0)
HEMOGLOBIN: 12.6 g/dL (ref 12.0–15.0)
Lymphocytes Relative: 26 %
Lymphs Abs: 1.7 10*3/uL (ref 0.7–4.0)
MCH: 30.7 pg (ref 26.0–34.0)
MCHC: 32.2 g/dL (ref 30.0–36.0)
MCV: 95.1 fL (ref 78.0–100.0)
Monocytes Absolute: 0.8 10*3/uL (ref 0.1–1.0)
Monocytes Relative: 12 %
NEUTROS PCT: 59 %
Neutro Abs: 4 10*3/uL (ref 1.7–7.7)
Platelets: 281 10*3/uL (ref 150–400)
RBC: 4.11 MIL/uL (ref 3.87–5.11)
RDW: 15.3 % (ref 11.5–15.5)
WBC: 6.6 10*3/uL (ref 4.0–10.5)

## 2014-11-11 LAB — BASIC METABOLIC PANEL
Anion gap: 7 (ref 5–15)
BUN: 10 mg/dL (ref 6–20)
CHLORIDE: 110 mmol/L (ref 101–111)
CO2: 22 mmol/L (ref 22–32)
Calcium: 7.9 mg/dL — ABNORMAL LOW (ref 8.9–10.3)
Creatinine, Ser: 1.02 mg/dL — ABNORMAL HIGH (ref 0.44–1.00)
GFR calc non Af Amer: 52 mL/min — ABNORMAL LOW (ref >60)
Glucose, Bld: 109 mg/dL — ABNORMAL HIGH (ref 65–99)
POTASSIUM: 3.6 mmol/L (ref 3.5–5.1)
SODIUM: 139 mmol/L (ref 135–145)

## 2014-11-11 LAB — URINE CULTURE: CULTURE: NO GROWTH

## 2014-11-11 LAB — GLUCOSE, CAPILLARY: GLUCOSE-CAPILLARY: 115 mg/dL — AB (ref 65–99)

## 2014-11-11 LAB — MAGNESIUM: MAGNESIUM: 2 mg/dL (ref 1.7–2.4)

## 2014-11-11 MED ORDER — METOPROLOL SUCCINATE ER 50 MG PO TB24: 50.0000 mg | ORAL_TABLET | Freq: Every day | ORAL | Status: DC

## 2014-11-11 MED ORDER — LEVOFLOXACIN 500 MG PO TABS: 500.0000 mg | ORAL_TABLET | Freq: Every day | ORAL | Status: DC

## 2014-11-11 NOTE — Discharge Summary (Signed)
Physician Discharge Summary   Patient ID: Lori Page MRN: [id number] DOB/AGE: 77-Oct-1937 77 y.o.  Admit date: 11/09/2014 Discharge date: 11/11/2014  Primary Care Physician:  Horatio Pel, MD  Discharge Diagnoses:    . sepsis of unclear etiology possible UTI  . Near Syncope . Essential hypertension . Dyslipidemia . Hypokalemia . CKD (chronic kidney disease) stage 2, GFR 60-89 ml/min . Breast cancer of lower-inner quadrant of right female breast . Thrombocytosis . Chronic diastolic CHF (congestive heart failure) . Fall  Consults: None   Recommendations for Outpatient Follow-up:  Please note telmisartan, HCTZ, Lasix has been placed on hold until PCP follow-up, then readjust medications.  Please follow CBC, BMET    DIET: Heart healthy diet    Allergies:  No Known Allergies   Discharge Medications:   Medication List    STOP taking these medications        furosemide 20 MG tablet  Commonly known as:  LASIX     telmisartan-hydrochlorothiazide 80-25 MG tablet  Commonly known as:  MICARDIS HCT      TAKE these medications        calcium carbonate 600 MG Tabs tablet  Commonly known as:  OS-CAL  Take 600 mg by mouth daily.     hydroxyurea 500 MG capsule  Commonly known as:  HYDREA  TAKE 1 CAPSULE (500 MG TOTAL) BY MOUTH DAILY. MAY TAKE WITH FOOD TO MINIMIZE GI SIDE EFFECTS.     levofloxacin 500 MG tablet  Commonly known as:  LEVAQUIN  Take 1 tablet (500 mg total) by mouth daily. X 3 days     metoprolol succinate 50 MG 24 hr tablet  Commonly known as:  TOPROL-XL  Take 1 tablet (50 mg total) by mouth daily. Take with or immediately following a meal.     pravastatin 80 MG tablet  Commonly known as:  PRAVACHOL  Take 1 tablet by mouth daily.     rivaroxaban 20 MG Tabs tablet  Commonly known as:  XARELTO  Take 20 mg by mouth daily with supper.         Brief H and P: For complete details please refer to admission H and P, but in brief, 79  year old female with past medical history of hypertension, DVT and PE (on anticoagulation with xarelto), breast cancer s/p mastectomy (in 1980's). She presented to [facility] ED status post fall at home in the bathroom. No reports of chest pain, or shortness of breath prior to the fall. She did feel lightheaded prior to the fall. No loss of consciousness. She was recently seen in ED (10/20/2014) status post fall (she fell while on the back porch trying to climb the stairs and she lost balance). She also had fevers and chills which started this morning, No reports of cough. She did have urinary urgency and frequency and some incontinence but no hematuria or dysuria. No abdominal pain, nausea or vomiting. No diarrhea or constipation.  In ED, BP was 101/56, HR 96, RR 18-27, T max 130.2 F and oxygen saturation 91-98% with [place] oxygen support. Blood work was notable for WBC count 13.9, potassium 3.4 and creatinine of 1.02 (baseline 1.18). No acute intracranial findings on CT head. CXR showed no acute cardiopulmonary process. UA was unremarkable. She was started on vanco and zosyn in ED and admitted for further evaluation of fever, sepsis.    Hospital Course:   Sepsis due to undetermined organism / Leukocytosis  - Sepsis criteria met on admission with fever of 103.2 F,  tachypnea, leukocytosis and lactic acid of 2.32. No obvious source of infection. Possible UTI since pt complained of urinary frequency and urgency however UA did not show leukocytes or nitrites. CXR showed no acute cardiopulmonary findings.  Lactic acid was elevated at the time of admission 3.5, improved to 1.8, troponins remained negative. Patient remained hemodynamically stable, did not require any pressors. She was placed on vancomycin and Zosyn, discharged on Levaquin for 3 days. Blood cultures remain negative.  Near syncope / Fall  - Probably vasovagal, from sepsis, low blood pressure  - CT head showed no acute intracranial findings  - negative  cardiac enzymes, echo on 9/27 showed chronic grade i diastolic dysfunction  PT evaluation showed no PT follow-up, patient independent with all mobility and able to walk without any assistive devices.   Breast cancer of lower-inner quadrant of right female breast  - Stage I right breast cancer diagnosed in 1984  - Status post mastectomy, adjuvant chemotherapy and adjuvant antiestrogen therapy.  - Mammogram 05/22/2014 No abnormalities.   Personal history of venous thrombosis and embolism  - On full dose anticoagulation with xarelto , monitor renal funciton   Essential hypertension  BP 129/80 at the time of discharge. Restarted Toprol-XL however continue to hold Lasix, telmisartan/HCTZ   Dyslipidemia  - Resume Pravachol   Chronic diastolic CHF - Last 2 D ECHO in 01/2014 with grade 1 diastolic dysfunction, normal EF - Compensated   Hypokalemia Resolved with supplementation.  CKD (chronic kidney disease) stage 2, GFR 60-89 ml/min  - Creatinine in 08/2010 was 1.18 and on this admission within baseline values (1.02)   Essential thrombocytosis  - Follows with Dr. Lindi Adie.  She is on hydrea , lft wnl, does has mild chronic lower extremity edema   DVT prophylaxis:  - On full dose AC with xarelto     Day of Discharge BP 129/80 mmHg  Pulse 101  Temp(Src) 98 F (36.7 C) (Oral)  Resp 18  Ht $R'5\' 5"'PS$  (1.651 m)  Wt 101.742 kg (224 lb 4.8 oz)  BMI 37.33 kg/m2  SpO2 98%  Physical Exam: General: Alert and awake oriented x3 not in any acute distress. HEENT: anicteric sclera, pupils reactive to light and accommodation CVS: S1-S2 clear no murmur rubs or gallops Chest: clear to auscultation bilaterally, no wheezing rales or rhonchi Abdomen: soft nontender, nondistended, normal bowel sounds Extremities: no cyanosis, clubbing or edema noted bilaterally Neuro: Cranial nerves II-XII intact, no focal neurological deficits   The results of significant diagnostics from this hospitalization  (including imaging, microbiology, ancillary and laboratory) are listed below for reference.    LAB RESULTS: Basic Metabolic Panel:  Recent Labs Lab 11/10/14 0153 11/11/14 0600  NA 137 139  K 3.1* 3.6  CL 104 110  CO2 23 22  GLUCOSE 132* 109*  BUN 13 10  CREATININE 1.19* 1.02*  CALCIUM 8.1* 7.9*  MG 2.2 2.0  PHOS 3.4  --    Liver Function Tests:  Recent Labs Lab 11/09/14 1939 11/10/14 0153  AST 28 25  ALT 14 14  ALKPHOS 55 55  BILITOT 0.6 0.9  PROT 5.6* 5.6*  ALBUMIN 2.7* 2.6*   No results for input(s): LIPASE, AMYLASE in the last 168 hours. No results for input(s): AMMONIA in the last 168 hours. CBC:  Recent Labs Lab 11/10/14 0153 11/11/14 0600  WBC 9.3 6.6  NEUTROABS  --  4.0  HGB 12.8 12.6  HCT 38.0 39.1  MCV 94.8 95.1  PLT 263 281   Cardiac Enzymes:  Recent Labs Lab 11/09/14 1939 11/10/14 0153  TROPONINI <0.03 <0.03   BNP: Invalid input(s): POCBNP CBG:  Recent Labs Lab 11/10/14 0738 11/11/14 0736  GLUCAP 126* 115*    Significant Diagnostic Studies:  Dg Chest 2 View  11/09/2014   CLINICAL DATA:  Fever.  Headache and dizziness.  EXAM: CHEST  2 VIEW  COMPARISON:  09/26/2008  FINDINGS: Heart size and pulmonary vascularity are normal and the lungs are clear. No effusions. Previous right mastectomy. No acute osseous abnormality.  IMPRESSION: No acute disease in the chest.   Electronically Signed   By: Lorriane Shire M.D.   On: 11/09/2014 12:35   Ct Head Wo Contrast  11/09/2014   CLINICAL DATA:  Pain following fall.  Dizziness.  EXAM: CT HEAD WITHOUT CONTRAST  TECHNIQUE: Contiguous axial images were obtained from the base of the skull through the vertex without intravenous contrast.  COMPARISON:  October 20, 2014  FINDINGS: The ventricles are normal in size and configuration. There is no intracranial mass, hemorrhage, extra-axial fluid collection, or midline shift. There is minimal small vessel disease in the centra semiovale bilaterally.  Gray-white compartments elsewhere normal. No acute infarct evident. The bony calvarium appears intact. The mastoid air cells are clear. There is mild mucosal thickening of several ethmoid air cells bilaterally.  IMPRESSION: Minimal periventricular small vessel disease. No acute infarct evident. No hemorrhage or mass effect. No extra-axial fluid collection. Mild ethmoid sinus disease bilaterally.   Electronically Signed   By: Lowella Grip III M.D.   On: 11/09/2014 11:38    2D ECHO: Study Conclusions  - Left ventricle: The cavity size was normal. Wall thickness was increased in a pattern of mild LVH. Systolic function was vigorous. The estimated ejection fraction was in the range of 65% to 70%. Wall motion was normal; there were no regional wall motion abnormalities. Doppler parameters are consistent with abnormal left ventricular relaxation (grade 1 diastolic dysfunction). Doppler parameters are consistent with elevated ventricular end-diastolic filling pressure. - Mitral valve: There was no significant regurgitation. - Right ventricle: The cavity size was normal. Wall thickness was normal. Systolic function was normal. - Atrial septum: There was increased thickness of the septum, consistent with lipomatous hypertrophy. - Tricuspid valve: There was no regurgitation.  Disposition and Follow-up: Discharge Instructions    Diet - low sodium heart healthy    Complete by:  As directed      Discharge instructions    Complete by:  As directed   Please hold Lasix, Telmisartan/ hydrochlorothiazide. You can resume half dose of topril XL 50mg  daily starting today. Check your BP daily. If you notice BP elevated consistently above 140/ 90, you can resume your previous dose of 100mg  daily of Toprol XL.     Increase activity slowly    Complete by:  As directed             DISPOSITION: home    DISCHARGE FOLLOW-UP Follow-up Information    Follow up with Horatio Pel, MD.   Specialty:  Internal Medicine   Why:  for hospital follow-up/ Appointment date:11/17/2014 @9 :30a   Contact information:   [address] [place] [address] [phone]        Time spent on Discharge: 60mins  Signed:   RAI,RIPUDEEP M.D. [facility] 11/11/2014, 11:33 AM Pager: [phone]

## 2014-11-11 NOTE — Progress Notes (Signed)
Lori Page to be D/C'd Home per MD order.  Discussed prescriptions and follow up appointments with the patient. Prescriptions given to patient, medication list explained in detail. Pt verbalized understanding.    Medication List    STOP taking these medications        furosemide 20 MG tablet  Commonly known as:  LASIX     telmisartan-hydrochlorothiazide 80-25 MG tablet  Commonly known as:  MICARDIS HCT      TAKE these medications        calcium carbonate 600 MG Tabs tablet  Commonly known as:  OS-CAL  Take 600 mg by mouth daily.     hydroxyurea 500 MG capsule  Commonly known as:  HYDREA  TAKE 1 CAPSULE (500 MG TOTAL) BY MOUTH DAILY. MAY TAKE WITH FOOD TO MINIMIZE GI SIDE EFFECTS.     levofloxacin 500 MG tablet  Commonly known as:  LEVAQUIN  Take 1 tablet (500 mg total) by mouth daily. X 3 days     metoprolol succinate 50 MG 24 hr tablet  Commonly known as:  TOPROL-XL  Take 1 tablet (50 mg total) by mouth daily. Take with or immediately following a meal.     pravastatin 80 MG tablet  Commonly known as:  PRAVACHOL  Take 1 tablet by mouth daily.     rivaroxaban 20 MG Tabs tablet  Commonly known as:  XARELTO  Take 20 mg by mouth daily with supper.        Filed Vitals:   11/11/14 1000  BP: 129/80  Pulse: 101  Temp: 98 F (36.7 C)  Resp: 18    Skin clean, dry and intact without evidence of skin break down, no evidence of skin tears noted. IV catheter discontinued intact. Site without signs and symptoms of complications. Dressing and pressure applied. Pt denies pain at this time. No complaints noted.  An After Visit Summary was printed and given to the patient. Patient escorted via [place], and D/C home via private auto.  Sanders,Jasmine A 11/11/2014 2:43 PM

## 2014-11-11 NOTE — Progress Notes (Signed)
PT Cancellation Note  Patient Details Name: Lori Page MRN: [id number] DOB: [birth year]   Cancelled Treatment:    Reason Eval/Treat Not Completed: New PT eval received, chart reviewed. It appears that pt was evaluated on 9/27 and discharged as pt was independent with all mobility and was able to walk 200' without an AD. No follow-up therapy recommended.   Rolinda Roan 11/11/2014, 7:04 AM   Rolinda Roan, PT, DPT Acute Rehabilitation Services Pager: [phone]

## 2014-11-12 LAB — HEMOGLOBIN A1C
Hgb A1c MFr Bld: 6.1 % — ABNORMAL HIGH (ref 4.8–5.6)
Mean Plasma Glucose: 128 mg/dL

## 2014-11-14 LAB — CULTURE, BLOOD (ROUTINE X 2)
CULTURE: NO GROWTH
CULTURE: NO GROWTH

## 2014-11-26 ENCOUNTER — Other Ambulatory Visit: Payer: Self-pay | Admitting: Internal Medicine

## 2014-11-30 ENCOUNTER — Other Ambulatory Visit: Payer: Self-pay | Admitting: Internal Medicine

## 2014-11-30 DIAGNOSIS — I10 Essential (primary) hypertension: Secondary | ICD-10-CM

## 2014-11-30 DIAGNOSIS — R0989 Other specified symptoms and signs involving the circulatory and respiratory systems: Secondary | ICD-10-CM

## 2014-12-04 ENCOUNTER — Ambulatory Visit
Admission: RE | Admit: 2014-12-04 | Discharge: 2014-12-04 | Disposition: A | Discharge status: Home / Self Care | Payer: Medicare Other | Source: Ambulatory Visit | Attending: Internal Medicine | Admitting: Internal Medicine

## 2014-12-04 DIAGNOSIS — R0989 Other specified symptoms and signs involving the circulatory and respiratory systems: Secondary | ICD-10-CM

## 2014-12-04 DIAGNOSIS — I10 Essential (primary) hypertension: Secondary | ICD-10-CM

## 2014-12-23 ENCOUNTER — Encounter: Payer: Self-pay | Admitting: Internal Medicine

## 2015-01-28 ENCOUNTER — Ambulatory Visit: Payer: Medicare Other | Admitting: Internal Medicine

## 2015-02-19 ENCOUNTER — Other Ambulatory Visit (HOSPITAL_BASED_OUTPATIENT_CLINIC_OR_DEPARTMENT_OTHER): Payer: Medicare Other

## 2015-02-19 ENCOUNTER — Telehealth: Payer: Self-pay | Admitting: Hematology and Oncology

## 2015-02-19 ENCOUNTER — Encounter: Payer: Self-pay | Admitting: Hematology and Oncology

## 2015-02-19 ENCOUNTER — Ambulatory Visit (HOSPITAL_BASED_OUTPATIENT_CLINIC_OR_DEPARTMENT_OTHER): Payer: Medicare Other | Admitting: Hematology and Oncology

## 2015-02-19 VITALS — BP 144/84 | HR 78 | Temp 98.0°F | Resp 18 | Ht 65.0 in | Wt 224.6 lb

## 2015-02-19 DIAGNOSIS — D473 Essential (hemorrhagic) thrombocythemia: Secondary | ICD-10-CM

## 2015-02-19 DIAGNOSIS — C50311 Malignant neoplasm of lower-inner quadrant of right female breast: Secondary | ICD-10-CM

## 2015-02-19 LAB — COMPREHENSIVE METABOLIC PANEL
ALT: 21 U/L (ref 0–55)
ANION GAP: 8 meq/L (ref 3–11)
AST: 23 U/L (ref 5–34)
Albumin: 3.4 g/dL — ABNORMAL LOW (ref 3.5–5.0)
Alkaline Phosphatase: 64 U/L (ref 40–150)
BILIRUBIN TOTAL: 0.35 mg/dL (ref 0.20–1.20)
BUN: 15.9 mg/dL (ref 7.0–26.0)
CALCIUM: 8.9 mg/dL (ref 8.4–10.4)
CO2: 25 meq/L (ref 22–29)
Chloride: 109 mEq/L (ref 98–109)
Creatinine: 1 mg/dL (ref 0.6–1.1)
EGFR: 65 mL/min/{1.73_m2} — AB (ref >90)
Glucose: 109 mg/dl (ref 70–140)
Potassium: 3.9 mEq/L (ref 3.5–5.1)
Sodium: 142 mEq/L (ref 136–145)
TOTAL PROTEIN: 6.8 g/dL (ref 6.4–8.3)

## 2015-02-19 LAB — CBC WITH DIFFERENTIAL/PLATELET
BASO%: 0.7 % (ref 0.0–2.0)
Basophils Absolute: 0 10*3/uL (ref 0.0–0.1)
EOS ABS: 0.1 10*3/uL (ref 0.0–0.5)
EOS%: 2.1 % (ref 0.0–7.0)
HEMATOCRIT: 44.7 % (ref 34.8–46.6)
HGB: 14.8 g/dL (ref 11.6–15.9)
LYMPH%: 38.6 % (ref 14.0–49.7)
MCH: 31 pg (ref 25.1–34.0)
MCHC: 33.1 g/dL (ref 31.5–36.0)
MCV: 93.7 fL (ref 79.5–101.0)
MONO#: 0.5 10*3/uL (ref 0.1–0.9)
MONO%: 8.6 % (ref 0.0–14.0)
NEUT%: 50 % (ref 38.4–76.8)
NEUTROS ABS: 2.9 10*3/uL (ref 1.5–6.5)
PLATELETS: 340 10*3/uL (ref 145–400)
RBC: 4.77 10*6/uL (ref 3.70–5.45)
RDW: 14.8 % — ABNORMAL HIGH (ref 11.2–14.5)
WBC: 5.7 10*3/uL (ref 3.9–10.3)
lymph#: 2.2 10*3/uL (ref 0.9–3.3)

## 2015-02-19 NOTE — Assessment & Plan Note (Signed)
Essential thrombocytosis: Well controlled with once a day Hydrea. Patient is asymptomatic. Her leg swelling has remained stable on lower dose of Hydrea. We'll continue her on once a day Hydrea for now.   Return to clinic in 6 months for lab work and follow-up. Her blood counts were reviewed today and they were normal. Patient will continue on blood thinners for history of recurrent blood clots. 

## 2015-02-19 NOTE — Addendum Note (Signed)
Addended by: Prentiss Bells on: 02/19/2015 11:48 AM   Modules accepted: Medications

## 2015-02-19 NOTE — Assessment & Plan Note (Signed)
Stage I right breast cancer diagnosed in 1984: Status post mastectomy, adjuvant chemotherapy and adjuvant antiestrogen therapy.  Breast Cancer Surveillance: 1. Breast exam 08/19/2014: Normal 2. Mammogram 05/22/2014 No abnormalities. Postsurgical changes. Breast Density Category B. I recommended that she get 3-D mammograms for surveillance. Discussed the differences between different breast density categories.

## 2015-02-19 NOTE — Progress Notes (Signed)
Patient Care Team: Deland Pretty, MD as PCP - General (Internal Medicine)  DIAGNOSIS: Breast cancer of lower-inner quadrant of right female breast [facility])   Staging form: Breast, AJCC 6th Edition     Clinical: Stage I (T1, N0, M0) - Signed by Rulon Eisenmenger, MD on 10/23/2013     Pathologic: Stage I (T1, N0, M0) - Signed by Rulon Eisenmenger, MD on 10/23/2013   SUMMARY OF ONCOLOGIC HISTORY:   Breast cancer of lower-inner quadrant of right female breast ([place])   09/23/1982 Surgery Right mastectomy foll by reconstruction; Stage 1   10/24/1982 - 03/26/1983 Chemotherapy Chemotherapy X 6 months   04/23/1983 - 10/23/1988 Anti-estrogen oral therapy Tamoxifen 20 mg daily    Essential thrombocytosis ([place]) (Resolved)   04/26/2010 Procedure Bone marrow biopsy: Hypercellular with features of myeloproliferative neoplasm differential diagnosis ET vs P. vera   04/26/2010 -  Chemotherapy Hydrea 500 mg twice daily; later changed to alternate once daily and twice daily (due to leg swelling); changed to once daily 10/24/13    CHIEF COMPLIANT: Follow-up of essential thrombocytosis and breast cancer  INTERVAL HISTORY: Lori Page is a 80 year old with above-mentioned history of essential thrombocytosis for which she is on Hydrea 5 mg once a day. She appears to be tolerating it extremely well. She does not have any nausea or vomiting. Does not have any leg swelling anymore. She is current on blood thinners with xarelto. She had her mammograms last year and they were normal. Denies any lumps or nodules breasts.  REVIEW OF SYSTEMS:   Constitutional: Denies fevers, chills or abnormal weight loss Eyes: Denies blurriness of vision Ears, nose, mouth, throat, and face: Denies mucositis or sore throat Respiratory: Denies cough, dyspnea or wheezes Cardiovascular: Denies palpitation, chest discomfort Gastrointestinal:  Denies nausea, heartburn or change in bowel habits Skin: Denies abnormal skin rashes Lymphatics: Denies  new lymphadenopathy or easy bruising Neurological:Denies numbness, tingling or new weaknesses Behavioral/Psych: Mood is stable, no new changes  Extremities: No lower extremity edema Breast:  denies any pain or lumps or nodules in either breasts All other systems were reviewed with the patient and are negative.  I have reviewed the past medical history, past surgical history, social history and family history with the patient and they are unchanged from previous note.  ALLERGIES:  has No Known Allergies.  MEDICATIONS:  Current Outpatient Prescriptions  Medication Sig Dispense Refill  . calcium carbonate (OS-CAL) 600 MG TABS Take 600 mg by mouth daily.    . hydroxyurea (HYDREA) 500 MG capsule TAKE 1 CAPSULE (500 MG TOTAL) BY MOUTH DAILY. MAY TAKE WITH FOOD TO MINIMIZE GI SIDE EFFECTS. 30 capsule 5  . levofloxacin (LEVAQUIN) 500 MG tablet Take 1 tablet (500 mg total) by mouth daily. X 3 days 3 tablet 0  . metoprolol succinate (TOPROL-XL) 50 MG 24 hr tablet Take 1 tablet (50 mg total) by mouth daily. Take with or immediately following a meal. 30 tablet 3  . pravastatin (PRAVACHOL) 80 MG tablet Take 1 tablet by mouth daily.  1  . rivaroxaban (XARELTO) 20 MG TABS tablet Take 20 mg by mouth daily with supper.     No current facility-administered medications for this visit.    PHYSICAL EXAMINATION: ECOG PERFORMANCE STATUS: 0 - Asymptomatic  Filed Vitals:   02/19/15 0859  BP: 144/84  Pulse: 78  Temp: 98 F (36.7 C)  Resp: 18   Filed Weights   02/19/15 0859  Weight: 224 lb 9.6 oz (101.878 kg)  GENERAL:alert, no distress and comfortable SKIN: skin color, texture, turgor are normal, no rashes or significant lesions EYES: normal, Conjunctiva are pink and non-injected, sclera clear OROPHARYNX:no exudate, no erythema and lips, buccal mucosa, and tongue normal  NECK: supple, thyroid normal size, non-tender, without nodularity LYMPH:  no palpable lymphadenopathy in the cervical,  axillary or inguinal LUNGS: clear to auscultation and percussion with normal breathing effort HEART: regular rate & rhythm and no murmurs and no lower extremity edema ABDOMEN:abdomen soft, non-tender and normal bowel sounds MUSCULOSKELETAL:no cyanosis of digits and no clubbing  NEURO: alert & oriented x 3 with fluent speech, no focal motor/sensory deficits EXTREMITIES: No lower extremity edema BREAST: No palpable masses or nodules in either right or left breasts. No palpable axillary supraclavicular or infraclavicular adenopathy no breast tenderness or nipple discharge. (exam performed in the presence of a chaperone)  LABORATORY DATA:  I have reviewed the data as listed   Chemistry      Component Value Date/Time   NA 139 11/11/2014 0600   NA 141 08/19/2014 0910   K 3.6 11/11/2014 0600   K 3.8 08/19/2014 0910   CL 110 11/11/2014 0600   CL 109* 12/25/2011 0836   CO2 22 11/11/2014 0600   CO2 24 08/19/2014 0910   BUN 10 11/11/2014 0600   BUN 16.0 08/19/2014 0910   CREATININE 1.02* 11/11/2014 0600   CREATININE 0.9 08/19/2014 0910      Component Value Date/Time   CALCIUM 7.9* 11/11/2014 0600   CALCIUM 9.3 08/19/2014 0910   ALKPHOS 55 11/10/2014 0153   ALKPHOS 62 08/19/2014 0910   AST 25 11/10/2014 0153   AST 21 08/19/2014 0910   ALT 14 11/10/2014 0153   ALT 16 08/19/2014 0910   BILITOT 0.9 11/10/2014 0153   BILITOT 0.49 08/19/2014 0910       Lab Results  Component Value Date   WBC 5.7 02/19/2015   HGB 14.8 02/19/2015   HCT 44.7 02/19/2015   MCV 93.7 02/19/2015   PLT 340 02/19/2015   NEUTROABS 2.9 02/19/2015     ASSESSMENT & PLAN:  Essential thrombocytosis (HCC) Essential thrombocytosis: Well controlled with once a day Hydrea. Patient is asymptomatic. Her leg swelling has remained stable on lower dose of Hydrea. We'll continue her on once a day Hydrea for now. Return to clinic in 6 months for lab work and follow-up. Her blood counts were reviewed today and they were  normal. Patient will continue on blood thinners for history of recurrent blood clots.  Breast cancer of lower-inner quadrant of right female breast Stage I right breast cancer diagnosed in 1984: Status post mastectomy, adjuvant chemotherapy and adjuvant antiestrogen therapy. Currently on surveillance  Breast Cancer Surveillance: 1. Breast exam 08/19/2014: Normal 2. Mammogram 05/22/2014 No abnormalities. Postsurgical changes. Breast Density Category B. I recommended that she get 3-D mammograms for surveillance. Discussed the differences between different breast density categories.  Return to clinic in 1 year for follow-up  No orders of the defined types were placed in this encounter.   The patient has a good understanding of the overall plan. she agrees with it. she will call with any problems that may develop before the next visit here.   Rulon Eisenmenger, MD 02/19/2015

## 2015-02-19 NOTE — Telephone Encounter (Signed)
Appointments made and avs printed for patient °

## 2015-02-23 ENCOUNTER — Ambulatory Visit (INDEPENDENT_AMBULATORY_CARE_PROVIDER_SITE_OTHER): Payer: Medicare Other | Admitting: Internal Medicine

## 2015-02-23 ENCOUNTER — Encounter: Payer: Self-pay | Admitting: Internal Medicine

## 2015-02-23 VITALS — BP 124/82 | HR 68 | Ht 65.0 in | Wt 224.8 lb

## 2015-02-23 DIAGNOSIS — E785 Hyperlipidemia, unspecified: Secondary | ICD-10-CM | POA: Diagnosis not present

## 2015-02-23 DIAGNOSIS — I10 Essential (primary) hypertension: Secondary | ICD-10-CM

## 2015-02-23 DIAGNOSIS — I5032 Chronic diastolic (congestive) heart failure: Secondary | ICD-10-CM | POA: Diagnosis not present

## 2015-02-23 DIAGNOSIS — I4510 Unspecified right bundle-branch block: Secondary | ICD-10-CM

## 2015-02-23 NOTE — Patient Instructions (Signed)
Medication Instructions:  Your physician recommends that you continue on your current medications as directed. Please refer to the Current Medication list given to you today.   Labwork: none  Testing/Procedures: none  Follow-Up: Your physician wants you to follow-up in: 12 months with Dr. Hilty.. You will receive a reminder letter in the mail two months in advance. If you don't receive a letter, please call our office to schedule the follow-up appointment.   Any Other Special Instructions Will Be Listed Below (If Applicable).     If you need a refill on your cardiac medications before your next appointment, please call your pharmacy.  

## 2015-02-23 NOTE — Progress Notes (Signed)
OFFICE NOTE  Chief Complaint:  Follow-up, no complaints  Primary Care Physician: Horatio Pel, MD  HPI:  Lori Page is a pleasant 80 year old female with past medical history significant for breast cancer, ET, and prior DVT on Xarelto. Recently she's been describing some increasing shortness of breath with exertion. An EKG performed at her primary care provider's office demonstrates a right bundle branch block and inferior T-wave abnormalities concerning for ischemia. There is a history of heart disease in her family including her mother who died suddenly of a massive MI at age 14 and a brother with a stroke and died PVCs as well as heart disease. She denies any chest pain. She does take Lasix however only twice weekly at most if needed. She occasionally gets some swelling and mostly in her left leg which could be related to post-thrombotic syndrome.  Lori Page returns today for follow-up. She reports her shortness of breath is not necessarily getting worse. She denies any chest pain. She underwent a nuclear stress test which was negative for ischemia. EF is preserved. She also had an echocardiogram which showed normal systolic function with mild diastolic dysfunction and mild pulmonary hypertension.  I saw Lori Page back in the office today for follow-up. Overall she is currently doing well. A few months ago she was hospitalized for what sounds like sepsis, including fever and dehydration as well as a syncopal episode. The etiology of this was never determined. She recovered fairly quickly and has had no further single episodes. She seems to be doing well on Xarelto without any bleeding complications. She had an echocardiogram during that admission which was essentially unchanged from her prior echo. She has known mild mitral regurgitation and an audible murmur. She denies any chest pain or worsening shortness of breath. Unfortunately her weight is about the same as it was  last year.  PMHx:  Past Medical History  Diagnosis Date  . Essential thrombocytosis ([place])   . Breast cancer ([place])   . HX: breast cancer 06/08/2011  . Essential thrombocytosis ([place]) 06/08/2011  . Hypertension     Past Surgical History  Procedure Laterality Date  . Mastectomy      FAMHx:  Family History  Problem Relation Age of Onset  . Heart disease Mother   . Heart attack Mother   . Cancer Father   . Stroke Brother   . Heart disease Brother   . Diabetes Brother     SOCHx:   reports that she has never smoked. She does not have any smokeless tobacco history on file. She reports that she does not drink alcohol or use illicit drugs.  ALLERGIES:  No Known Allergies  ROS: A comprehensive review of systems was negative.  HOME MEDS: Current Outpatient Prescriptions  Medication Sig Dispense Refill  . calcium carbonate (OS-CAL) 600 MG TABS Take 600 mg by mouth daily.    . hydroxyurea (HYDREA) 500 MG capsule TAKE 1 CAPSULE (500 MG TOTAL) BY MOUTH DAILY. MAY TAKE WITH FOOD TO MINIMIZE GI SIDE EFFECTS. 30 capsule 5  . metoprolol succinate (TOPROL-XL) 50 MG 24 hr tablet Take 1 tablet (50 mg total) by mouth daily. Take with or immediately following a meal. 30 tablet 3  . pravastatin (PRAVACHOL) 80 MG tablet Take 1 tablet by mouth daily.  1  . rivaroxaban (XARELTO) 20 MG TABS tablet Take 20 mg by mouth daily with supper.    . triamterene-hydrochlorothiazide (MAXZIDE-25) 37.5-25 MG tablet   0   No current facility-administered medications  for this visit.    LABS/IMAGING: No results found for this or any previous visit (from the past 48 hour(s)). No results found.  VITALS: BP 124/82 mmHg  Pulse 68  Ht 5\' 5"  (1.651 m)  Wt 224 lb 12.8 oz (101.969 kg)  BMI 37.41 kg/m2  EXAM: General appearance: alert, no distress and moderately obese Neck: no carotid bruit and no JVD Lungs: clear to auscultation bilaterally Heart: regular rate and rhythm, S1, S2 normal, no murmur, click, rub  or gallop Abdomen: soft, non-tender; bowel sounds normal; no masses,  no organomegaly Extremities: extremities normal, atraumatic, no cyanosis or edema Pulses: 2+ and symmetric Skin: Skin color, texture, turgor normal. No rashes or lesions Neurologic: Grossly normal Psych: Pleasant  EKG: Normal sinus rhythm at 68, RBBB  ASSESSMENT: 1. Hypertension-controlled 2. RBBB 3. History of PE on Xarelto 4. Dyslipidemia 5. Mild pulmonary hypertension  PLAN: 1.   Lori Page is doing well on Xarelto without recurrence of DVT or PE. She has a stable right bundle branch block. Her blood pressure is well controlled. Cholesterol is followed by her primary care provider. She did have some mild pulmonary hypertension with an echo last year. Repeat echo a few months ago did not demonstrate any TR to quantify her pulmonary pressures. She's not done significant exertion to elicit shortness of breath. I've encouraged her to work on some more exercise and weight loss over the next year. Plan to see her back annually or sooner as necessary.  Pixie Casino, MD, [facility] Attending Cardiologist Perry C Hilty 02/23/2015, 8:41 AM

## 2015-03-15 ENCOUNTER — Other Ambulatory Visit: Payer: Self-pay | Admitting: Hematology and Oncology

## 2015-03-15 NOTE — Telephone Encounter (Signed)
Chart reviewed.

## 2015-04-08 DIAGNOSIS — Z1211 Encounter for screening for malignant neoplasm of colon: Secondary | ICD-10-CM | POA: Diagnosis not present

## 2015-04-15 DIAGNOSIS — Z0100 Encounter for examination of eyes and vision without abnormal findings: Secondary | ICD-10-CM | POA: Diagnosis not present

## 2015-04-21 ENCOUNTER — Other Ambulatory Visit: Payer: Self-pay

## 2015-04-21 DIAGNOSIS — Z1231 Encounter for screening mammogram for malignant neoplasm of breast: Secondary | ICD-10-CM

## 2015-05-24 ENCOUNTER — Ambulatory Visit
Admission: RE | Admit: 2015-05-24 | Discharge: 2015-05-24 | Disposition: A | Discharge status: Home / Self Care | Payer: Medicare Other | Source: Ambulatory Visit

## 2015-05-24 DIAGNOSIS — Z1231 Encounter for screening mammogram for malignant neoplasm of breast: Secondary | ICD-10-CM | POA: Diagnosis not present

## 2015-06-29 DIAGNOSIS — R7309 Other abnormal glucose: Secondary | ICD-10-CM | POA: Diagnosis not present

## 2015-06-29 DIAGNOSIS — Z6838 Body mass index (BMI) 38.0-38.9, adult: Secondary | ICD-10-CM | POA: Diagnosis not present

## 2015-06-29 DIAGNOSIS — I10 Essential (primary) hypertension: Secondary | ICD-10-CM | POA: Diagnosis not present

## 2015-07-22 DIAGNOSIS — I10 Essential (primary) hypertension: Secondary | ICD-10-CM | POA: Diagnosis not present

## 2015-07-22 DIAGNOSIS — E0789 Other specified disorders of thyroid: Secondary | ICD-10-CM | POA: Diagnosis not present

## 2015-07-29 DIAGNOSIS — E041 Nontoxic single thyroid nodule: Secondary | ICD-10-CM | POA: Diagnosis not present

## 2015-07-29 DIAGNOSIS — I10 Essential (primary) hypertension: Secondary | ICD-10-CM | POA: Diagnosis not present

## 2015-07-29 DIAGNOSIS — E789 Disorder of lipoprotein metabolism, unspecified: Secondary | ICD-10-CM | POA: Diagnosis not present

## 2015-08-27 ENCOUNTER — Encounter: Payer: Self-pay | Admitting: Genetic Counselor

## 2015-08-31 ENCOUNTER — Encounter: Payer: Self-pay | Admitting: Genetic Counselor

## 2015-09-29 ENCOUNTER — Encounter: Payer: Medicare Other | Admitting: Genetic Counselor

## 2015-09-29 ENCOUNTER — Other Ambulatory Visit: Payer: Medicare Other

## 2015-12-22 DIAGNOSIS — Z0000 Encounter for general adult medical examination without abnormal findings: Secondary | ICD-10-CM | POA: Diagnosis not present

## 2015-12-22 DIAGNOSIS — Z23 Encounter for immunization: Secondary | ICD-10-CM | POA: Diagnosis not present

## 2015-12-22 DIAGNOSIS — I10 Essential (primary) hypertension: Secondary | ICD-10-CM | POA: Diagnosis not present

## 2015-12-22 DIAGNOSIS — E7800 Pure hypercholesterolemia, unspecified: Secondary | ICD-10-CM | POA: Diagnosis not present

## 2015-12-22 DIAGNOSIS — Z9221 Personal history of antineoplastic chemotherapy: Secondary | ICD-10-CM | POA: Diagnosis not present

## 2015-12-22 DIAGNOSIS — Z780 Asymptomatic menopausal state: Secondary | ICD-10-CM | POA: Diagnosis not present

## 2016-01-05 DIAGNOSIS — Z1212 Encounter for screening for malignant neoplasm of rectum: Secondary | ICD-10-CM | POA: Diagnosis not present

## 2016-01-05 DIAGNOSIS — Z86711 Personal history of pulmonary embolism: Secondary | ICD-10-CM | POA: Diagnosis not present

## 2016-01-05 DIAGNOSIS — E7800 Pure hypercholesterolemia, unspecified: Secondary | ICD-10-CM | POA: Diagnosis not present

## 2016-01-05 DIAGNOSIS — Z86718 Personal history of other venous thrombosis and embolism: Secondary | ICD-10-CM | POA: Diagnosis not present

## 2016-01-05 DIAGNOSIS — Z7901 Long term (current) use of anticoagulants: Secondary | ICD-10-CM | POA: Diagnosis not present

## 2016-01-05 DIAGNOSIS — Z0001 Encounter for general adult medical examination with abnormal findings: Secondary | ICD-10-CM | POA: Diagnosis not present

## 2016-01-05 DIAGNOSIS — I10 Essential (primary) hypertension: Secondary | ICD-10-CM | POA: Diagnosis not present

## 2016-01-05 DIAGNOSIS — Z23 Encounter for immunization: Secondary | ICD-10-CM | POA: Diagnosis not present

## 2016-01-13 DIAGNOSIS — Z86718 Personal history of other venous thrombosis and embolism: Secondary | ICD-10-CM | POA: Diagnosis not present

## 2016-01-13 DIAGNOSIS — I10 Essential (primary) hypertension: Secondary | ICD-10-CM | POA: Diagnosis not present

## 2016-01-13 DIAGNOSIS — D473 Essential (hemorrhagic) thrombocythemia: Secondary | ICD-10-CM | POA: Diagnosis not present

## 2016-01-13 DIAGNOSIS — E039 Hypothyroidism, unspecified: Secondary | ICD-10-CM | POA: Diagnosis not present

## 2016-01-20 DIAGNOSIS — E039 Hypothyroidism, unspecified: Secondary | ICD-10-CM | POA: Diagnosis not present

## 2016-01-27 DIAGNOSIS — E032 Hypothyroidism due to medicaments and other exogenous substances: Secondary | ICD-10-CM | POA: Diagnosis not present

## 2016-01-27 DIAGNOSIS — E789 Disorder of lipoprotein metabolism, unspecified: Secondary | ICD-10-CM | POA: Diagnosis not present

## 2016-01-27 DIAGNOSIS — I10 Essential (primary) hypertension: Secondary | ICD-10-CM | POA: Diagnosis not present

## 2016-02-21 ENCOUNTER — Other Ambulatory Visit: Payer: Self-pay | Admitting: Emergency Medicine

## 2016-02-21 DIAGNOSIS — C50311 Malignant neoplasm of lower-inner quadrant of right female breast: Secondary | ICD-10-CM

## 2016-02-21 NOTE — Assessment & Plan Note (Signed)
Stage I right breast cancer diagnosed in 1984: Status post mastectomy, adjuvant chemotherapy and adjuvant antiestrogen therapy. Currently on surveillance  Breast Cancer Surveillance: 1. Breast exam 02/22/16  2. Mammogram 05/24/15: No abnormalities. Postsurgical changes. Breast Density Category B. I recommended that Lori Page get 3-D mammograms for surveillance. Discussed the differences between different breast density categories.  Return to clinic in 1 year for follow-up

## 2016-02-21 NOTE — Assessment & Plan Note (Signed)
Essential thrombocytosis: Well controlled with once a day Hydrea. Patient is asymptomatic. Her leg swelling has remained stable on lower dose of Hydrea. We'll continue her on once a day Hydrea for now.   Return to clinic in 6 months for lab work and follow-up. Her blood counts were reviewed today and they were normal. Patient will continue on blood thinners for history of recurrent blood clots.

## 2016-02-22 ENCOUNTER — Encounter: Payer: Self-pay | Admitting: Hematology and Oncology

## 2016-02-22 ENCOUNTER — Other Ambulatory Visit (HOSPITAL_BASED_OUTPATIENT_CLINIC_OR_DEPARTMENT_OTHER): Payer: Medicare Other

## 2016-02-22 ENCOUNTER — Ambulatory Visit (HOSPITAL_BASED_OUTPATIENT_CLINIC_OR_DEPARTMENT_OTHER): Payer: Medicare Other | Admitting: Hematology and Oncology

## 2016-02-22 VITALS — BP 136/87 | HR 74 | Temp 98.2°F | Resp 17 | Ht 65.0 in | Wt 227.6 lb

## 2016-02-22 DIAGNOSIS — Z170 Estrogen receptor positive status [ER+]: Principal | ICD-10-CM

## 2016-02-22 DIAGNOSIS — D473 Essential (hemorrhagic) thrombocythemia: Secondary | ICD-10-CM

## 2016-02-22 DIAGNOSIS — Z853 Personal history of malignant neoplasm of breast: Secondary | ICD-10-CM | POA: Diagnosis not present

## 2016-02-22 DIAGNOSIS — C50311 Malignant neoplasm of lower-inner quadrant of right female breast: Secondary | ICD-10-CM

## 2016-02-22 LAB — COMPREHENSIVE METABOLIC PANEL
ALT: 14 U/L (ref 0–55)
AST: 22 U/L (ref 5–34)
Albumin: 3.3 g/dL — ABNORMAL LOW (ref 3.5–5.0)
Alkaline Phosphatase: 68 U/L (ref 40–150)
Anion Gap: 8 mEq/L (ref 3–11)
BUN: 11.6 mg/dL (ref 7.0–26.0)
CHLORIDE: 108 meq/L (ref 98–109)
CO2: 27 meq/L (ref 22–29)
Calcium: 8.9 mg/dL (ref 8.4–10.4)
Creatinine: 1 mg/dL (ref 0.6–1.1)
EGFR: 66 mL/min/{1.73_m2} — AB (ref >90)
GLUCOSE: 104 mg/dL (ref 70–140)
POTASSIUM: 3.7 meq/L (ref 3.5–5.1)
SODIUM: 144 meq/L (ref 136–145)
Total Bilirubin: 0.49 mg/dL (ref 0.20–1.20)
Total Protein: 6.6 g/dL (ref 6.4–8.3)

## 2016-02-22 LAB — CBC & DIFF AND RETIC
BASO%: 0.5 % (ref 0.0–2.0)
Basophils Absolute: 0 10*3/uL (ref 0.0–0.1)
EOS%: 2.8 % (ref 0.0–7.0)
Eosinophils Absolute: 0.2 10*3/uL (ref 0.0–0.5)
HCT: 44.3 % (ref 34.8–46.6)
HGB: 14.6 g/dL (ref 11.6–15.9)
IMMATURE RETIC FRACT: 9.2 % (ref 1.60–10.00)
LYMPH%: 36.6 % (ref 14.0–49.7)
MCH: 31.3 pg (ref 25.1–34.0)
MCHC: 33 g/dL (ref 31.5–36.0)
MCV: 94.9 fL (ref 79.5–101.0)
MONO#: 0.5 10*3/uL (ref 0.1–0.9)
MONO%: 8.1 % (ref 0.0–14.0)
NEUT%: 52 % (ref 38.4–76.8)
NEUTROS ABS: 3 10*3/uL (ref 1.5–6.5)
Platelets: 341 10*3/uL (ref 145–400)
RBC: 4.67 10*6/uL (ref 3.70–5.45)
RDW: 15.5 % — ABNORMAL HIGH (ref 11.2–14.5)
Retic %: 1.49 % (ref 0.70–2.10)
Retic Ct Abs: 69.58 10*3/uL (ref 33.70–90.70)
WBC: 5.7 10*3/uL (ref 3.9–10.3)
lymph#: 2.1 10*3/uL (ref 0.9–3.3)

## 2016-02-22 MED ORDER — HYDROXYUREA 500 MG PO CAPS: 500.0000 mg | ORAL_CAPSULE | ORAL | 11 refills | Status: DC

## 2016-02-22 NOTE — Progress Notes (Signed)
Patient Care Team: Deland Pretty, MD as PCP - General (Internal Medicine)  DIAGNOSIS:  Encounter Diagnoses  Name Primary?  . Malignant neoplasm of lower-inner quadrant of right breast of female, estrogen receptor positive ([place]) Yes  . Essential thrombocytosis ([place])     SUMMARY OF ONCOLOGIC HISTORY:   Breast cancer of lower-inner quadrant of right female breast ([place])   09/23/1982 Surgery    Right mastectomy foll by reconstruction; Stage 1      10/24/1982 - 03/26/1983 Chemotherapy    Chemotherapy X 6 months      04/23/1983 - 10/23/1988 Anti-estrogen oral therapy    Tamoxifen 20 mg daily       Essential thrombocytosis ([place])   04/26/2010 Procedure    Bone marrow biopsy: Hypercellular with features of myeloproliferative neoplasm differential diagnosis ET vs P. vera      04/26/2010 -  Chemotherapy    Hydrea 500 mg twice daily; later changed to alternate once daily and twice daily (due to leg swelling); changed to once daily 10/24/13; decrease to 3 times a week on 02/22/2016       CHIEF COMPLIANT: Follow-up of right breast cancer and essential thrombocytosis  INTERVAL HISTORY: Lori Page is a 81 year old with above-mentioned history of right breast cancer treated with right Mastectomy followed by adjuvant chemotherapy and tamoxifen that was completed in 1990s. Later on she was diagnosed with essential thrombocytosis in 2012 and was treated with hydroxyurea. She had leg swelling related to hydroxyurea which appears to be under good control. She denies any lumps or nodules in the breasts.  REVIEW OF SYSTEMS:   Constitutional: Denies fevers, chills or abnormal weight loss Eyes: Denies blurriness of vision Ears, nose, mouth, throat, and face: Denies mucositis or sore throat Respiratory: Denies cough, dyspnea or wheezes Cardiovascular: Denies palpitation, chest discomfort Gastrointestinal:  Denies nausea, heartburn or change in bowel habits Skin: Denies abnormal skin  rashes Lymphatics: Denies new lymphadenopathy or easy bruising Neurological:Denies numbness, tingling or new weaknesses Behavioral/Psych: Mood is stable, no new changes  Extremities: No lower extremity edema Breast:  denies any pain or lumps or nodules in either breasts All other systems were reviewed with the patient and are negative.  I have reviewed the past medical history, past surgical history, social history and family history with the patient and they are unchanged from previous note.  ALLERGIES:  has No Known Allergies.  MEDICATIONS:  Current Outpatient Prescriptions  Medication Sig Dispense Refill  . calcium carbonate (OS-CAL) 600 MG TABS Take 600 mg by mouth daily.    Derrill Memo ON 02/23/2016] hydroxyurea (HYDREA) 500 MG capsule Take 1 capsule (500 mg total) by mouth 3 (three) times a week. May take with food to minimize GI side effects. 30 capsule 11  . metoprolol succinate (TOPROL-XL) 50 MG 24 hr tablet Take 1 tablet (50 mg total) by mouth daily. Take with or immediately following a meal. 30 tablet 3  . pravastatin (PRAVACHOL) 80 MG tablet Take 1 tablet by mouth daily.  1  . rivaroxaban (XARELTO) 20 MG TABS tablet Take 20 mg by mouth daily with supper.    . triamterene-hydrochlorothiazide (MAXZIDE-25) 37.5-25 MG tablet   0   No current facility-administered medications for this visit.     PHYSICAL EXAMINATION: ECOG PERFORMANCE STATUS: 1 - Symptomatic but completely ambulatory  Vitals:   02/22/16 0904  BP: 136/87  Pulse: 74  Resp: 17  Temp: 98.2 F (36.8 C)   Filed Weights   02/22/16 0904  Weight: 227 lb 9.6  oz (103.2 kg)    GENERAL:alert, no distress and comfortable SKIN: skin color, texture, turgor are normal, no rashes or significant lesions EYES: normal, Conjunctiva are pink and non-injected, sclera clear OROPHARYNX:no exudate, no erythema and lips, buccal mucosa, and tongue normal  NECK: supple, thyroid normal size, non-tender, without nodularity LYMPH:  no  palpable lymphadenopathy in the cervical, axillary or inguinal LUNGS: clear to auscultation and percussion with normal breathing effort HEART: regular rate & rhythm and no murmurs and no lower extremity edema ABDOMEN:abdomen soft, non-tender and normal bowel sounds MUSCULOSKELETAL:no cyanosis of digits and no clubbing  NEURO: alert & oriented x 3 with fluent speech, no focal motor/sensory deficits EXTREMITIES: No lower extremity edema BREAST: No palpable masses or nodules in either right or left breasts. No palpable axillary supraclavicular or infraclavicular adenopathy no breast tenderness or nipple discharge. (exam performed in the presence of a chaperone)  LABORATORY DATA:  I have reviewed the data as listed   Chemistry      Component Value Date/Time   NA 142 02/19/2015 0848   K 3.9 02/19/2015 0848   CL 110 11/11/2014 0600   CL 109 (H) 12/25/2011 0836   CO2 25 02/19/2015 0848   BUN 15.9 02/19/2015 0848   CREATININE 1.0 02/19/2015 0848      Component Value Date/Time   CALCIUM 8.9 02/19/2015 0848   ALKPHOS 64 02/19/2015 0848   AST 23 02/19/2015 0848   ALT 21 02/19/2015 0848   BILITOT 0.35 02/19/2015 0848       Lab Results  Component Value Date   WBC 5.7 02/22/2016   HGB 14.6 02/22/2016   HCT 44.3 02/22/2016   MCV 94.9 02/22/2016   PLT 341 02/22/2016   NEUTROABS 3.0 02/22/2016    ASSESSMENT & PLAN:  Essential thrombocytosis (HCC) Essential thrombocytosis: Well controlled with once a day Hydrea.  She continues to have leg swelling although it appears to be well controlled on once a day Hydrea.  Her blood counts were reviewed today, WBC count 5.7, hemoglobin 14.8, platelets 340, ANC 2.9 Patient will continue on blood thinners for history of recurrent blood clots.  Breast cancer of lower-inner quadrant of right female breast Stage I right breast cancer diagnosed in 1984: Status post mastectomy, adjuvant chemotherapy and adjuvant antiestrogen therapy. Currently on  surveillance  Breast Cancer Surveillance:  1. Breast exam 02/22/16  2. Mammogram 05/24/15: No abnormalities. Postsurgical changes. Breast Density Category B. I recommended that she get 3-D mammograms for surveillance. Discussed the differences between different breast density categories. I discussed with her about the importance of losing some weight to stay healthy.  Return to clinic in 1 year for follow-up   I spent 25 minutes talking to the patient of which more than half was spent in counseling and coordination of care.  Orders Placed This Encounter  Procedures  . CBC with Differential    Standing Status:   Future    Standing Expiration Date:   02/21/2017   The patient has a good understanding of the overall plan. she agrees with it. she will call with any problems that may develop before the next visit here.   Gudena, Vinay K, MD 02/22/16    

## 2016-03-13 ENCOUNTER — Other Ambulatory Visit: Payer: Self-pay | Admitting: Internal Medicine

## 2016-03-13 DIAGNOSIS — Z9011 Acquired absence of right breast and nipple: Secondary | ICD-10-CM

## 2016-03-13 DIAGNOSIS — Z1231 Encounter for screening mammogram for malignant neoplasm of breast: Secondary | ICD-10-CM

## 2016-04-18 ENCOUNTER — Other Ambulatory Visit: Payer: Self-pay | Admitting: Hematology and Oncology

## 2016-04-27 ENCOUNTER — Other Ambulatory Visit: Payer: Self-pay | Admitting: Hematology and Oncology

## 2016-04-27 ENCOUNTER — Other Ambulatory Visit: Payer: Self-pay | Admitting: Emergency Medicine

## 2016-05-01 ENCOUNTER — Other Ambulatory Visit: Payer: Self-pay | Admitting: Hematology and Oncology

## 2016-05-03 ENCOUNTER — Encounter (HOSPITAL_COMMUNITY): Payer: Self-pay | Admitting: Emergency Medicine

## 2016-05-03 DIAGNOSIS — R000 Tachycardia, unspecified: Secondary | ICD-10-CM | POA: Diagnosis present

## 2016-05-03 DIAGNOSIS — I130 Hypertensive heart and chronic kidney disease with heart failure and stage 1 through stage 4 chronic kidney disease, or unspecified chronic kidney disease: Secondary | ICD-10-CM | POA: Diagnosis present

## 2016-05-03 DIAGNOSIS — J9811 Atelectasis: Secondary | ICD-10-CM | POA: Diagnosis not present

## 2016-05-03 DIAGNOSIS — E876 Hypokalemia: Secondary | ICD-10-CM | POA: Diagnosis present

## 2016-05-03 DIAGNOSIS — E785 Hyperlipidemia, unspecified: Secondary | ICD-10-CM | POA: Diagnosis present

## 2016-05-03 DIAGNOSIS — L03115 Cellulitis of right lower limb: Secondary | ICD-10-CM | POA: Diagnosis not present

## 2016-05-03 DIAGNOSIS — D473 Essential (hemorrhagic) thrombocythemia: Secondary | ICD-10-CM | POA: Diagnosis present

## 2016-05-03 DIAGNOSIS — N182 Chronic kidney disease, stage 2 (mild): Secondary | ICD-10-CM | POA: Diagnosis present

## 2016-05-03 DIAGNOSIS — M7989 Other specified soft tissue disorders: Secondary | ICD-10-CM | POA: Diagnosis present

## 2016-05-03 DIAGNOSIS — A419 Sepsis, unspecified organism: Secondary | ICD-10-CM | POA: Diagnosis not present

## 2016-05-03 DIAGNOSIS — I5032 Chronic diastolic (congestive) heart failure: Secondary | ICD-10-CM | POA: Diagnosis present

## 2016-05-03 DIAGNOSIS — Z7901 Long term (current) use of anticoagulants: Secondary | ICD-10-CM

## 2016-05-03 DIAGNOSIS — Z79899 Other long term (current) drug therapy: Secondary | ICD-10-CM

## 2016-05-03 DIAGNOSIS — Z86718 Personal history of other venous thrombosis and embolism: Secondary | ICD-10-CM

## 2016-05-03 LAB — CBC WITH DIFFERENTIAL/PLATELET
BASOS ABS: 0 10*3/uL (ref 0.0–0.1)
Basophils Relative: 0 %
EOS PCT: 0 %
Eosinophils Absolute: 0 10*3/uL (ref 0.0–0.7)
HCT: 46.6 % — ABNORMAL HIGH (ref 36.0–46.0)
Hemoglobin: 15.6 g/dL — ABNORMAL HIGH (ref 12.0–15.0)
LYMPHS PCT: 5 %
Lymphs Abs: 0.8 10*3/uL (ref 0.7–4.0)
MCH: 30.5 pg (ref 26.0–34.0)
MCHC: 33.5 g/dL (ref 30.0–36.0)
MCV: 91.2 fL (ref 78.0–100.0)
MONO ABS: 0.9 10*3/uL (ref 0.1–1.0)
Monocytes Relative: 6 %
Neutro Abs: 13.4 10*3/uL — ABNORMAL HIGH (ref 1.7–7.7)
Neutrophils Relative %: 89 %
PLATELETS: 452 10*3/uL — AB (ref 150–400)
RBC: 5.11 MIL/uL (ref 3.87–5.11)
RDW: 14 % (ref 11.5–15.5)
WBC: 15.2 10*3/uL — ABNORMAL HIGH (ref 4.0–10.5)

## 2016-05-03 NOTE — ED Triage Notes (Signed)
Pt. reports nausea and urinary frequency onset 3 months ago , denies emesis , no fever or diarrhea .

## 2016-05-04 ENCOUNTER — Inpatient Hospital Stay (HOSPITAL_COMMUNITY)
Admission: EM | Admit: 2016-05-04 | Discharge: 2016-05-06 | DRG: 872 | Disposition: A | Discharge status: Home / Self Care | Payer: Medicare Other | Attending: Nephrology | Admitting: Nephrology

## 2016-05-04 ENCOUNTER — Emergency Department (HOSPITAL_COMMUNITY): Payer: Medicare Other

## 2016-05-04 ENCOUNTER — Inpatient Hospital Stay (HOSPITAL_COMMUNITY): Payer: Medicare Other

## 2016-05-04 ENCOUNTER — Encounter (HOSPITAL_COMMUNITY): Payer: Self-pay | Admitting: Family Medicine

## 2016-05-04 DIAGNOSIS — Z7901 Long term (current) use of anticoagulants: Secondary | ICD-10-CM | POA: Diagnosis not present

## 2016-05-04 DIAGNOSIS — M7989 Other specified soft tissue disorders: Secondary | ICD-10-CM | POA: Diagnosis not present

## 2016-05-04 DIAGNOSIS — I5032 Chronic diastolic (congestive) heart failure: Secondary | ICD-10-CM | POA: Diagnosis not present

## 2016-05-04 DIAGNOSIS — D473 Essential (hemorrhagic) thrombocythemia: Secondary | ICD-10-CM

## 2016-05-04 DIAGNOSIS — E876 Hypokalemia: Secondary | ICD-10-CM | POA: Diagnosis present

## 2016-05-04 DIAGNOSIS — L03115 Cellulitis of right lower limb: Secondary | ICD-10-CM | POA: Diagnosis not present

## 2016-05-04 DIAGNOSIS — A419 Sepsis, unspecified organism: Principal | ICD-10-CM

## 2016-05-04 DIAGNOSIS — I130 Hypertensive heart and chronic kidney disease with heart failure and stage 1 through stage 4 chronic kidney disease, or unspecified chronic kidney disease: Secondary | ICD-10-CM | POA: Diagnosis present

## 2016-05-04 DIAGNOSIS — I82409 Acute embolism and thrombosis of unspecified deep veins of unspecified lower extremity: Secondary | ICD-10-CM

## 2016-05-04 DIAGNOSIS — R000 Tachycardia, unspecified: Secondary | ICD-10-CM | POA: Diagnosis present

## 2016-05-04 DIAGNOSIS — Z86718 Personal history of other venous thrombosis and embolism: Secondary | ICD-10-CM | POA: Diagnosis not present

## 2016-05-04 DIAGNOSIS — N182 Chronic kidney disease, stage 2 (mild): Secondary | ICD-10-CM | POA: Diagnosis present

## 2016-05-04 DIAGNOSIS — I10 Essential (primary) hypertension: Secondary | ICD-10-CM

## 2016-05-04 DIAGNOSIS — N289 Disorder of kidney and ureter, unspecified: Secondary | ICD-10-CM

## 2016-05-04 DIAGNOSIS — E785 Hyperlipidemia, unspecified: Secondary | ICD-10-CM | POA: Diagnosis present

## 2016-05-04 DIAGNOSIS — J9811 Atelectasis: Secondary | ICD-10-CM | POA: Diagnosis not present

## 2016-05-04 DIAGNOSIS — M1711 Unilateral primary osteoarthritis, right knee: Secondary | ICD-10-CM | POA: Diagnosis not present

## 2016-05-04 DIAGNOSIS — Z79899 Other long term (current) drug therapy: Secondary | ICD-10-CM | POA: Diagnosis not present

## 2016-05-04 HISTORY — DX: Acute embolism and thrombosis of unspecified deep veins of unspecified lower extremity: I82.409

## 2016-05-04 HISTORY — DX: Malignant neoplasm of unspecified site of right female breast: C50.911

## 2016-05-04 HISTORY — DX: Headache, unspecified: R51.9

## 2016-05-04 HISTORY — DX: Hypothyroidism, unspecified: E03.9

## 2016-05-04 HISTORY — DX: Headache: R51

## 2016-05-04 LAB — PROCALCITONIN: Procalcitonin: 4.76 ng/mL

## 2016-05-04 LAB — INFLUENZA PANEL BY PCR (TYPE A & B)
INFLBPCR: NEGATIVE
Influenza A By PCR: NEGATIVE

## 2016-05-04 LAB — PROTIME-INR
INR: 1.32
Prothrombin Time: 16.5 seconds — ABNORMAL HIGH (ref 11.4–15.2)

## 2016-05-04 LAB — COMPREHENSIVE METABOLIC PANEL
ALT: 21 U/L (ref 14–54)
AST: 32 U/L (ref 15–41)
Albumin: 3.9 g/dL (ref 3.5–5.0)
Alkaline Phosphatase: 58 U/L (ref 38–126)
Anion gap: 13 (ref 5–15)
BUN: 13 mg/dL (ref 6–20)
CHLORIDE: 102 mmol/L (ref 101–111)
CO2: 23 mmol/L (ref 22–32)
CREATININE: 1.19 mg/dL — AB (ref 0.44–1.00)
Calcium: 9.2 mg/dL (ref 8.9–10.3)
GFR, EST AFRICAN AMERICAN: 49 mL/min — AB (ref >60)
GFR, EST NON AFRICAN AMERICAN: 43 mL/min — AB (ref >60)
Glucose, Bld: 146 mg/dL — ABNORMAL HIGH (ref 65–99)
POTASSIUM: 3.7 mmol/L (ref 3.5–5.1)
Sodium: 138 mmol/L (ref 135–145)
Total Bilirubin: 0.8 mg/dL (ref 0.3–1.2)
Total Protein: 7.4 g/dL (ref 6.5–8.1)

## 2016-05-04 LAB — URINALYSIS, ROUTINE W REFLEX MICROSCOPIC
BACTERIA UA: NONE SEEN
Bilirubin Urine: NEGATIVE
GLUCOSE, UA: NEGATIVE mg/dL
HGB URINE DIPSTICK: NEGATIVE
Ketones, ur: NEGATIVE mg/dL
LEUKOCYTES UA: NEGATIVE
NITRITE: NEGATIVE
PROTEIN: 30 mg/dL — AB
Specific Gravity, Urine: 1.021 (ref 1.005–1.030)
pH: 5 (ref 5.0–8.0)

## 2016-05-04 LAB — APTT: aPTT: 35 seconds (ref 24–36)

## 2016-05-04 LAB — HIV ANTIBODY (ROUTINE TESTING W REFLEX): HIV Screen 4th Generation wRfx: NONREACTIVE

## 2016-05-04 LAB — LACTIC ACID, PLASMA
LACTIC ACID, VENOUS: 3.1 mmol/L — AB (ref 0.5–1.9)
LACTIC ACID, VENOUS: 3.1 mmol/L — AB (ref 0.5–1.9)

## 2016-05-04 MED ORDER — SODIUM CHLORIDE 0.9 % IV BOLUS (SEPSIS)
1000.0000 mL | Freq: Once | INTRAVENOUS | Status: AC
Start: 2016-05-04 — End: 2016-05-04
  Administered 2016-05-04: 1000 mL via INTRAVENOUS

## 2016-05-04 MED ORDER — ONDANSETRON HCL 4 MG PO TABS: 4.0000 mg | ORAL_TABLET | Freq: Four times a day (QID) | ORAL | Status: DC | PRN

## 2016-05-04 MED ORDER — ACETAMINOPHEN 650 MG RE SUPP: 650.0000 mg | Freq: Four times a day (QID) | RECTAL | Status: DC | PRN

## 2016-05-04 MED ORDER — CLINDAMYCIN PHOSPHATE 600 MG/50ML IV SOLN
600.0000 mg | Freq: Three times a day (TID) | INTRAVENOUS | Status: DC
  Administered 2016-05-04 – 2016-05-06 (×6): 600 mg via INTRAVENOUS
  Filled 2016-05-04 (×6): qty 50

## 2016-05-04 MED ORDER — SODIUM CHLORIDE 0.9 % IV BOLUS (SEPSIS)
500.0000 mL | Freq: Once | INTRAVENOUS | Status: AC
  Administered 2016-05-04: 500 mL via INTRAVENOUS

## 2016-05-04 MED ORDER — METOPROLOL SUCCINATE ER 50 MG PO TB24
50.0000 mg | ORAL_TABLET | Freq: Every day | ORAL | Status: DC
  Administered 2016-05-04 – 2016-05-06 (×3): 50 mg via ORAL
  Filled 2016-05-04 (×3): qty 1

## 2016-05-04 MED ORDER — DEXTROSE 5 % IV SOLN
1.0000 g | Freq: Once | INTRAVENOUS | Status: AC
  Administered 2016-05-04: 1 g via INTRAVENOUS
  Filled 2016-05-04: qty 10

## 2016-05-04 MED ORDER — RIVAROXABAN 20 MG PO TABS
20.0000 mg | ORAL_TABLET | Freq: Every day | ORAL | Status: DC
  Administered 2016-05-04 – 2016-05-05 (×2): 20 mg via ORAL
  Filled 2016-05-04 (×2): qty 1

## 2016-05-04 MED ORDER — VANCOMYCIN HCL IN DEXTROSE 1-5 GM/200ML-% IV SOLN
1000.0000 mg | Freq: Once | INTRAVENOUS | Status: AC
  Administered 2016-05-04: 1000 mg via INTRAVENOUS
  Filled 2016-05-04: qty 200

## 2016-05-04 MED ORDER — SODIUM CHLORIDE 0.9 % IV BOLUS (SEPSIS)
1000.0000 mL | Freq: Once | INTRAVENOUS | Status: AC
  Administered 2016-05-04: 1000 mL via INTRAVENOUS

## 2016-05-04 MED ORDER — HYDROXYUREA 500 MG PO CAPS
500.0000 mg | ORAL_CAPSULE | ORAL | Status: DC
  Administered 2016-05-05: 500 mg via ORAL
  Filled 2016-05-04: qty 1

## 2016-05-04 MED ORDER — ACETAMINOPHEN 325 MG PO TABS
650.0000 mg | ORAL_TABLET | Freq: Four times a day (QID) | ORAL | Status: DC | PRN
  Administered 2016-05-04 (×2): 650 mg via ORAL
  Filled 2016-05-04 (×2): qty 2

## 2016-05-04 MED ORDER — PRAVASTATIN SODIUM 40 MG PO TABS
80.0000 mg | ORAL_TABLET | Freq: Every evening | ORAL | Status: DC
  Administered 2016-05-04 – 2016-05-05 (×2): 80 mg via ORAL
  Filled 2016-05-04 (×2): qty 2

## 2016-05-04 MED ORDER — OXYCODONE HCL 5 MG PO TABS
5.0000 mg | ORAL_TABLET | ORAL | Status: DC | PRN
  Administered 2016-05-04 – 2016-05-06 (×6): 5 mg via ORAL
  Filled 2016-05-04 (×6): qty 1

## 2016-05-04 MED ORDER — SODIUM CHLORIDE 0.9 % IV SOLN
INTRAVENOUS | Status: DC
  Administered 2016-05-04: 1000 mL via INTRAVENOUS
  Administered 2016-05-04: 900 mL via INTRAVENOUS
  Administered 2016-05-05: 05:00:00 via INTRAVENOUS

## 2016-05-04 MED ORDER — ONDANSETRON HCL 4 MG/2ML IJ SOLN
4.0000 mg | Freq: Four times a day (QID) | INTRAMUSCULAR | Status: DC | PRN
Start: 2016-05-04 — End: 2016-05-06

## 2016-05-04 MED ORDER — KETOROLAC TROMETHAMINE 15 MG/ML IJ SOLN: 15.0000 mg | Freq: Four times a day (QID) | INTRAMUSCULAR | Status: DC | PRN

## 2016-05-04 NOTE — H&P (Signed)
History and Physical    Lori Page UVO:[id number] DOB: November 08, 1935 DOA: 05/04/2016  PCP: Horatio Pel, MD Patient coming from: home  Chief Complaint: muscle aches and leg pain  HPI: Lori Page is a 81 y.o. female with medical history significant of breast cancer, thrombocytosis, HTN,  Recurrent DVTs. Presenting with several week history of muscle aches and general fatigue. Fairly constant. Getting worse. Over-the-counter medications with little to no relief. Patient also endorses urinary frequency. Denies dysuria, fevers, back pain, chest pain, shortness of breath, palpitations, neck stiffness, headache, LOC. Patient also endorses right lower extremity swelling and redness with tenderness to touch in certain movements. All of patient's symptoms became significantly worse over the last day.  ED Course: Objective findings outlined below. Patient started on aggressive IV hydration and antibiotics.  Review of Systems: As per HPI otherwise 10 point review of systems negative.   Ambulatory Status: No restrictions  Past Medical History:  Diagnosis Date  . Breast cancer ([place])   . DVT, lower extremity, recurrent ([place])   . Essential thrombocytosis ([place])   . Essential thrombocytosis ([place]) 06/08/2011  . HX: breast cancer 06/08/2011  . Hypertension     Past Surgical History:  Procedure Laterality Date  . MASTECTOMY      Social History   Social History  . Marital status: Married    Spouse name: N/A  . Number of children: N/A  . Years of education: N/A   Occupational History  . Not on file.   Social History Main Topics  . Smoking status: Never Smoker  . Smokeless tobacco: Never Used  . Alcohol use No  . Drug use: No  . Sexual activity: Yes   Other Topics Concern  . Not on file   Social History Narrative  . No narrative on file    No Known Allergies  Family History  Problem Relation Age of Onset  . Heart disease Mother   . Heart attack Mother   .  Cancer Father   . Stroke Brother   . Heart disease Brother   . Diabetes Brother     Prior to Admission medications   Medication Sig Start Date End Date Taking? Authorizing Provider  calcium carbonate (OS-CAL) 600 MG TABS Take 600 mg by mouth daily.   Yes Historical Provider, MD  hydroxyurea (HYDREA) 500 MG capsule Take 1 capsule (500 mg total) by mouth 3 (three) times a week. May take with food to minimize GI side effects. Patient taking differently: Take 500 mg by mouth every Monday, Wednesday, and Friday.  02/23/16  Yes Nicholas Lose, MD  metoprolol succinate (TOPROL-XL) 50 MG 24 hr tablet Take 1 tablet (50 mg total) by mouth daily. Take with or immediately following a meal. 11/11/14  Yes Ripudeep K Rai, MD  pravastatin (PRAVACHOL) 80 MG tablet Take 80 mg by mouth every evening.  12/23/13  Yes Historical Provider, MD  rivaroxaban (XARELTO) 20 MG TABS tablet Take 20 mg by mouth daily with supper.   Yes Historical Provider, MD  hydroxyurea (HYDREA) 500 MG capsule TAKE ONE CAPSULE BY MOUTH ONCE DAILY *MAY  TAKE  WITH  FOOD  TO  MINIMIZE  GASTROINTESTINAL  SIDE  EFFECTS Patient not taking: Reported on 05/04/2016 05/01/16   Nicholas Lose, MD    Physical Exam: Vitals:   05/04/16 0600 05/04/16 0615 05/04/16 0632 05/04/16 0700  BP: 124/85 133/83 134/80 140/85  Pulse: (!) 106 (!) 105 (!) 105 (!) 106  Resp: 18 20 20 18   Temp:  TempSrc:      SpO2: 100% 98% 98% 98%     General:  Appears calm and comfortable Eyes:  PERRL, EOMI, normal lids, iris ENT:  grossly normal hearing, lips & tongue, mmm Neck:  no LAD, masses or thyromegaly Cardiovascular:  RRR, no m/r/g. No LE edema.  Respiratory:  CTA bilaterally, no w/r/r. Normal respiratory effort. Abdomen:  soft, ntnd, NABS Skin:  Right lower extremity erythema and swelling along the proximal foot, ankle and distal leg. No appreciable rash Musculoskeletal:  grossly normal tone BUE/BLE, good ROM, no bony abnormality Psychiatric:  grossly normal  mood and affect, speech fluent and appropriate, AOx3 Neurologic:  CN 2-12 grossly intact, moves all extremities in coordinated fashion, sensation intact  Labs on Admission: I have personally reviewed following labs and imaging studies  CBC:  Recent Labs Lab 05/03/16 2308  WBC 15.2*  NEUTROABS 13.4*  HGB 15.6*  HCT 46.6*  MCV 91.2  PLT 742*   Basic Metabolic Panel:  Recent Labs Lab 05/03/16 2308  NA 138  K 3.7  CL 102  CO2 23  GLUCOSE 146*  BUN 13  CREATININE 1.19*  CALCIUM 9.2   GFR: CrCl cannot be calculated (Unknown ideal weight.). Liver Function Tests:  Recent Labs Lab 05/03/16 2308  AST 32  ALT 21  ALKPHOS 58  BILITOT 0.8  PROT 7.4  ALBUMIN 3.9   No results for input(s): LIPASE, AMYLASE in the last 168 hours. No results for input(s): AMMONIA in the last 168 hours. Coagulation Profile: No results for input(s): INR, PROTIME in the last 168 hours. Cardiac Enzymes: No results for input(s): CKTOTAL, CKMB, CKMBINDEX, TROPONINI in the last 168 hours. BNP (last 3 results) No results for input(s): PROBNP in the last 8760 hours. HbA1C: No results for input(s): HGBA1C in the last 72 hours. CBG: No results for input(s): GLUCAP in the last 168 hours. Lipid Profile: No results for input(s): CHOL, HDL, LDLCALC, TRIG, CHOLHDL, LDLDIRECT in the last 72 hours. Thyroid Function Tests: No results for input(s): TSH, T4TOTAL, FREET4, T3FREE, THYROIDAB in the last 72 hours. Anemia Panel: No results for input(s): VITAMINB12, FOLATE, FERRITIN, TIBC, IRON, RETICCTPCT in the last 72 hours. Urine analysis:    Component Value Date/Time   COLORURINE YELLOW 05/04/2016 0218   APPEARANCEUR HAZY (A) 05/04/2016 0218   LABSPEC 1.021 05/04/2016 0218   PHURINE 5.0 05/04/2016 0218   GLUCOSEU NEGATIVE 05/04/2016 0218   HGBUR NEGATIVE 05/04/2016 0218   BILIRUBINUR NEGATIVE 05/04/2016 0218   KETONESUR NEGATIVE 05/04/2016 0218   PROTEINUR 30 (A) 05/04/2016 0218   UROBILINOGEN 0.2  11/09/2014 1051   NITRITE NEGATIVE 05/04/2016 0218   LEUKOCYTESUR NEGATIVE 05/04/2016 0218    Creatinine Clearance: CrCl cannot be calculated (Unknown ideal weight.).  Sepsis Labs: @LABRCNTIP (procalcitonin:4,lacticidven:4) )No results found for this or any previous visit (from the past 240 hour(s)).   Radiological Exams on Admission: Dg Chest 2 View  Result Date: 05/04/2016 CLINICAL DATA:  81 year old female with sepsis and frequent urination and chills. EXAM: CHEST  2 VIEW COMPARISON:  Chest radiograph dated 11/09/2014 FINDINGS: There is shallow inspiration with minimal bibasilar atelectatic changes. Left lung base hazy density likely represent atelectatic changes and interstitial crowding. Pneumonia is less likely but not excluded. Clinical correlation is recommended. There is no pleural effusion or pneumothorax. Stable top-normal cardiac size. No acute osseous pathology identified. Right axillary surgical clips noted. IMPRESSION: Shallow inspiration and minimal bibasilar atelectasis. Left lung base developing infiltrate is less likely but not excluded. Clinical correlation is recommended. Electronically  Signed   By: Anner Crete M.D.   On: 05/04/2016 03:35   Dg Tibia/fibula Right  Result Date: 05/04/2016 CLINICAL DATA:  Cellulitis.  No injury. EXAM: RIGHT TIBIA AND FIBULA - 2 VIEW COMPARISON:  No recent. FINDINGS: Diffuse soft tissue swelling. No definite soft tissue air noted. Degenerative changes right knee and ankle. No focal bony abnormality. IMPRESSION: 1. Diffuse soft tissue swelling noted. No definite soft tissue air noted. No acute or focal bony abnormality. 2. Degenerative changes right knee and ankle. Electronically Signed   By: Marcello Moores  Register   On: 05/04/2016 08:04     Assessment/Plan Active Problems:   Essential thrombocytosis (H[facility])   Sepsis due to undetermined organism ([place])   Essential hypertension   Dyslipidemia   Chronic diastolic CHF (congestive heart failure)  (HCC)   Cellulitis of right lower extremity   DVT, lower extremity, recurrent ([place])   Renal insufficiency   RLE celulitis: Right lower extremity. Patient technically meet sepsis criteria. Tachycardia with temp of 99.5, lactic acid 3.1, tachycardia, RR 20, WBC 15.2 with left shift.  - De-escalate antibiotic from ceftriaxone and vancomycin to clindamycin alone. - IVF - Cellulitis/sepsis protocols initiated  Renal insufficiency: mild Cr 1.2 w/ baseline 1.0. Likely from poor oral intake and infection - IVF, BMET in am  HLD: - continue statin  Chronic diastolic congestive heart failure: Last echo from 2016 showing EF of 55-60% and grade 1 diastolic dysfunction. Currently no evidence of decompensation. - Stick I's and O's, daily weights  Thrombocytosis: Plt 452. - continue hydroxyurea  Recurrent DVT: - contineu Xarelto  HTN:  - continue metoprolol   DVT prophylaxis: Xarelto  Code Status: full  Family Communication: husband  Disposition Plan: pending improvement  Consults called: none  Admission status: inpt    Marilynne Dupuis J MD [facility]  If 7PM-7AM, please contact night-coverage [email or web address] Password [facility]  05/04/2016, 8:35 AM

## 2016-05-04 NOTE — Progress Notes (Signed)
Patient trasfered from ED to [phone] via stretcher; alert and oriented x 4; complaints of pain in right lower leg; IV in RAC running NS@100cc /hr; skin intact. Orient patient to room and unit; gave patient care guide; instructed how to use the call bell and  fall risk precautions. Will continue to monitor the patient.

## 2016-05-04 NOTE — ED Provider Notes (Signed)
[facility] DEPT Provider Note   CSN: [id number] Arrival date & time: 05/03/16  2259     History   Chief Complaint Chief Complaint  Patient presents with  . Nausea  . Urinary Frequency    HPI Lori Page is a 81 y.o. female.  Patient presents with generalized muscle aches, fatigue and weakness, and urinary frequency. She reports symptoms started one month ago but have gotten progressively worse. She has nausea without vomiting. No diarrhea, cough, SOB, chest pain or congestion.  She has not seen her primary care physician about her symptoms.   The history is provided by the patient. No language interpreter was used.  Urinary Frequency  Pertinent negatives include no chest pain, no abdominal pain and no shortness of breath.    Past Medical History:  Diagnosis Date  . Breast cancer ([place])   . Essential thrombocytosis ([place])   . Essential thrombocytosis ([place]) 06/08/2011  . HX: breast cancer 06/08/2011  . Hypertension     Patient Active Problem List   Diagnosis Date Noted  . Sepsis due to undetermined organism ([place]) 11/09/2014  . Leukocytosis 11/09/2014  . Essential hypertension 11/09/2014  . Dyslipidemia 11/09/2014  . Hypokalemia 11/09/2014  . CKD (chronic kidney disease) stage 2, GFR 60-89 ml/min 11/09/2014  . Chronic diastolic CHF (congestive heart failure) ([place]) 11/09/2014  . Near syncope 11/09/2014  . Fall 11/09/2014  . RBBB 12/29/2013  . Personal history of venous thrombosis and embolism 10/23/2013  . Essential thrombocytosis ([place]) 06/08/2011  . Breast cancer of lower-inner quadrant of right female breast ([place]) 10/08/1982    Past Surgical History:  Procedure Laterality Date  . MASTECTOMY      OB History    No data available       Home Medications    Prior to Admission medications   Medication Sig Start Date End Date Taking? Authorizing Provider  calcium carbonate (OS-CAL) 600 MG TABS Take 600 mg by mouth daily.   Yes Historical Provider, MD    hydroxyurea (HYDREA) 500 MG capsule Take 1 capsule (500 mg total) by mouth 3 (three) times a week. May take with food to minimize GI side effects. Patient taking differently: Take 500 mg by mouth every Monday, Wednesday, and Friday.  02/23/16  Yes Nicholas Lose, MD  metoprolol succinate (TOPROL-XL) 50 MG 24 hr tablet Take 1 tablet (50 mg total) by mouth daily. Take with or immediately following a meal. 11/11/14  Yes Ripudeep K Rai, MD  pravastatin (PRAVACHOL) 80 MG tablet Take 80 mg by mouth every evening.  12/23/13  Yes Historical Provider, MD  rivaroxaban (XARELTO) 20 MG TABS tablet Take 20 mg by mouth daily with supper.   Yes Historical Provider, MD  hydroxyurea (HYDREA) 500 MG capsule TAKE ONE CAPSULE BY MOUTH ONCE DAILY *MAY  TAKE  WITH  FOOD  TO  MINIMIZE  GASTROINTESTINAL  SIDE  EFFECTS Patient not taking: Reported on 05/04/2016 05/01/16   Nicholas Lose, MD    Family History Family History  Problem Relation Age of Onset  . Heart disease Mother   . Heart attack Mother   . Cancer Father   . Stroke Brother   . Heart disease Brother   . Diabetes Brother     Social History Social History  Substance Use Topics  . Smoking status: Never Smoker  . Smokeless tobacco: Never Used  . Alcohol use No     Allergies   Patient has no known allergies.   Review of Systems Review of Systems  Constitutional: Positive for fatigue. Negative for chills and fever.  HENT: Negative.   Respiratory: Negative.  Negative for cough and shortness of breath.   Cardiovascular: Negative.  Negative for chest pain.  Gastrointestinal: Positive for nausea. Negative for abdominal pain and vomiting.  Genitourinary: Positive for frequency. Negative for dysuria, flank pain and hematuria.  Musculoskeletal: Positive for myalgias.  Skin: Negative.   Neurological: Positive for weakness.     Physical Exam Updated Vital Signs BP 117/84 (BP Location: Right Arm)   Pulse (!) 103   Temp 99.5 F (37.5 C) (Oral)    Resp 16   SpO2 99%   Physical Exam  Constitutional: She is oriented to person, place, and time. She appears well-developed and well-nourished.  HENT:  Head: Normocephalic.  Mouth/Throat: Mucous membranes are dry.  Neck: Normal range of motion. Neck supple.  Cardiovascular: Regular rhythm.  Tachycardia present.   Pulmonary/Chest: Effort normal and breath sounds normal.  Abdominal: Soft. Bowel sounds are normal. There is no tenderness. There is no rebound and no guarding.  Musculoskeletal: Normal range of motion.  Neurological: She is alert and oriented to person, place, and time. No cranial nerve deficit.  Skin: Skin is warm and dry. No rash noted.  Psychiatric: She has a normal mood and affect.     ED Treatments / Results  Labs (all labs ordered are listed, but only abnormal results are displayed) Labs Reviewed  CBC WITH DIFFERENTIAL/PLATELET - Abnormal; Notable for the following:       Result Value   WBC 15.2 (*)    Hemoglobin 15.6 (*)    HCT 46.6 (*)    Platelets 452 (*)    Neutro Abs 13.4 (*)    All other components within normal limits  COMPREHENSIVE METABOLIC PANEL - Abnormal; Notable for the following:    Glucose, Bld 146 (*)    Creatinine, Ser 1.19 (*)    GFR calc non Af Amer 43 (*)    GFR calc Af Amer 49 (*)    All other components within normal limits  URINE CULTURE  URINALYSIS, ROUTINE W REFLEX MICROSCOPIC  LACTIC ACID, PLASMA  LACTIC ACID, PLASMA    EKG  EKG Interpretation None       Radiology No results found.  Procedures Procedures (including critical care time)  Medications Ordered in ED Medications  sodium chloride 0.9 % bolus 500 mL (500 mLs Intravenous New Bag/Given 05/04/16 0236)     Initial Impression / Assessment and Plan / ED Course  I have reviewed the triage vital signs and the nursing notes.  Pertinent labs & imaging results that were available during my care of the patient were reviewed by me and considered in my medical  decision making (see chart for details).     Patient presents with generalized muscle aches, fatigue progressive weakness and urinary frequency. Symptoms x 1 month but significantly worse in the last couple of days.   Labs are inconclusive. Elevated lactate to 3.1. UA negative. CXR negative  On re-evaluation, the patient is complaining increased right LE aching and pain. Leg examined and found to be be significantly red and hot to touch. No lesions, ulcerations, drainage. Concerning for cellulitis as source of infection. Abx started. Patient will need to be admitted for IV antibiotic therapy and fluids. Discussed with TRH for admission.  Final Clinical Impressions(s) / ED Diagnoses   Final diagnoses:  None   1. Right lower extremity cellulitis   New Prescriptions New Prescriptions   No medications on  file     Charlann Lange, PA-C 05/07/16 [place], MD 05/08/16 [phone]

## 2016-05-04 NOTE — ED Notes (Signed)
Patient transported to X-ray 

## 2016-05-04 NOTE — ED Notes (Signed)
Pt returned to room from xray.

## 2016-05-04 NOTE — ED Notes (Signed)
Orders for blood cultures placed after rocephin was hung.  Day shift RN and day shift phlebotomy tech aware.  Blood cultures will be drawn before vancomycin given.

## 2016-05-04 NOTE — ED Notes (Addendum)
Pt had large BM in bedpan.

## 2016-05-05 ENCOUNTER — Inpatient Hospital Stay (HOSPITAL_COMMUNITY): Payer: Medicare Other

## 2016-05-05 DIAGNOSIS — M7989 Other specified soft tissue disorders: Secondary | ICD-10-CM

## 2016-05-05 LAB — COMPREHENSIVE METABOLIC PANEL
ALK PHOS: 47 U/L (ref 38–126)
ALT: 17 U/L (ref 14–54)
AST: 33 U/L (ref 15–41)
Albumin: 2.4 g/dL — ABNORMAL LOW (ref 3.5–5.0)
Anion gap: 7 (ref 5–15)
BUN: 11 mg/dL (ref 6–20)
CALCIUM: 7.8 mg/dL — AB (ref 8.9–10.3)
CHLORIDE: 108 mmol/L (ref 101–111)
CO2: 23 mmol/L (ref 22–32)
CREATININE: 0.97 mg/dL (ref 0.44–1.00)
GFR calc non Af Amer: 55 mL/min — ABNORMAL LOW (ref >60)
Glucose, Bld: 121 mg/dL — ABNORMAL HIGH (ref 65–99)
Potassium: 3.2 mmol/L — ABNORMAL LOW (ref 3.5–5.1)
SODIUM: 138 mmol/L (ref 135–145)
Total Bilirubin: 0.7 mg/dL (ref 0.3–1.2)
Total Protein: 5.6 g/dL — ABNORMAL LOW (ref 6.5–8.1)

## 2016-05-05 LAB — CBC
HCT: 38.4 % (ref 36.0–46.0)
Hemoglobin: 12.5 g/dL (ref 12.0–15.0)
MCH: 29.7 pg (ref 26.0–34.0)
MCHC: 32.6 g/dL (ref 30.0–36.0)
MCV: 91.2 fL (ref 78.0–100.0)
PLATELETS: 337 10*3/uL (ref 150–400)
RBC: 4.21 MIL/uL (ref 3.87–5.11)
RDW: 14.7 % (ref 11.5–15.5)
WBC: 11.2 10*3/uL — ABNORMAL HIGH (ref 4.0–10.5)

## 2016-05-05 LAB — URINE CULTURE

## 2016-05-05 LAB — TSH: TSH: 0.581 u[IU]/mL (ref 0.350–4.500)

## 2016-05-05 MED ORDER — POTASSIUM CHLORIDE IN NACL 40-0.9 MEQ/L-% IV SOLN
INTRAVENOUS | Status: DC
  Administered 2016-05-05: 75 mL/h via INTRAVENOUS
  Administered 2016-05-06: 50 mL/h via INTRAVENOUS
  Filled 2016-05-05 (×2): qty 1000

## 2016-05-05 NOTE — Progress Notes (Signed)
PROGRESS NOTE    Lori Page  PTW:[id number] DOB: 09/22/1935 DOA: 05/04/2016 PCP: Horatio Pel, MD   Brief Narrative: 81 y.o. female with medical history significant of breast cancer, thrombocytosis, HTN,  Recurrent DVTs. Presenting with several week history of muscle aches and general fatigue. Patient also endorses right lower extremity swelling and redness with tenderness to touch in certain movements. All of patient's symptoms became significantly worse over the last day.  Assessment & Plan:   # Right lower extremity cellulitis: Patient with fever, leukocytosis, elevated lactate which is concerning for possible mild sepsis on admission. Patient with no improvement in her cellulitis today. We will continue IV clindamycin. Given history of DVT, I will check Doppler ultrasound. -Continue IV fluids, supportive care. -Follow up culture results. -PT, OT evaluation  #History of DVT: Continue xarelto  #Hypertension: Continue metoprolol. Monitor blood pressure.  #Dyslipidemia: Continue statin  #Mild thrombocytosis: Currently on hydroxyurea. Monitor platelet count.  #Mild hypokalemia: Replete potassium chloride. Repeat lab in the morning.  #Chronic diastolic congestive heart failure:Last echo from 2016 showing EF of 55-60% and grade 1 diastolic dysfunction. Currently no evidence of decompensation.  I do not think patient has acute kidney injury. Continue to monitor labs.  Active Problems:   Essential thrombocytosis (HCC)   Sepsis due to undetermined organism [facility])   Essential hypertension   Dyslipidemia   Chronic diastolic CHF (congestive heart failure) (HCC)   Cellulitis of right lower extremity   DVT, lower extremity, recurrent (HCC)    DVT prophylaxis: Systemic anticoagulation Code Status: Full code. Discussed CODE STATUS with the patient and family's. Family Communication: Patient's husband, son and daughter at bedside Disposition Plan: Likely discharge home in  1-2 days    Consultants:   None  Procedures: None Antimicrobials: IV clindamycin since 3/22  Subjective: Patient was seen and examined at bedside. Patient reported pain on right lower extremity associated with swelling and redness, no significant improvement. He denied chills, nausea, vomiting, chest pain, shortness of breath.  Objective: Vitals:   05/05/16 0458 05/05/16 0503 05/05/16 0950 05/05/16 1400  BP: 124/66  123/64   Pulse: 81  95   Resp: 18     Temp: 99 F (37.2 C)     TempSrc:      SpO2: 98%     Weight:  105.1 kg (231 lb 9.6 oz)    Height:    5\' 5"  (1.651 m)    Intake/Output Summary (Last 24 hours) at 05/05/16 1441 Last data filed at 05/05/16 1313  Gross per 24 hour  Intake             2290 ml  Output              700 ml  Net             1590 ml   Filed Weights   05/05/16 0503  Weight: 105.1 kg (231 lb 9.6 oz)    Examination:  General exam: Appears calm and comfortable  Respiratory system: Clear to auscultation. Respiratory effort normal. No wheezing or crackle Cardiovascular system: S1 & S2 heard, RRR.  No pedal edema. Gastrointestinal system: Abdomen is nondistended, soft and nontender. Normal bowel sounds heard. Central nervous system: Alert and oriented. No focal neurological deficits. Extremities: Right lower extremity swelling, mild redness and shiny skin with left lower extremity. Skin: Redness and right lower extremities. No other rashes. Psychiatry: Judgement and insight appear normal. Mood & affect appropriate.     Data Reviewed: I have personally reviewed following  labs and imaging studies  CBC:  Recent Labs Lab 05/03/16 2308 05/05/16 0419  WBC 15.2* 11.2*  NEUTROABS 13.4*  --   HGB 15.6* 12.5  HCT 46.6* 38.4  MCV 91.2 91.2  PLT 452* 081   Basic Metabolic Panel:  Recent Labs Lab 05/03/16 2308 05/05/16 0419  NA 138 138  K 3.7 3.2*  CL 102 108  CO2 23 23  GLUCOSE 146* 121*  BUN 13 11  CREATININE 1.19* 0.97  CALCIUM  9.2 7.8*   GFR: Estimated Creatinine Clearance: 57.5 mL/min (by C-G formula based on SCr of 0.97 mg/dL). Liver Function Tests:  Recent Labs Lab 05/03/16 2308 05/05/16 0419  AST 32 33  ALT 21 17  ALKPHOS 58 47  BILITOT 0.8 0.7  PROT 7.4 5.6*  ALBUMIN 3.9 2.4*   No results for input(s): LIPASE, AMYLASE in the last 168 hours. No results for input(s): AMMONIA in the last 168 hours. Coagulation Profile:  Recent Labs Lab 05/04/16 0838  INR 1.32   Cardiac Enzymes: No results for input(s): CKTOTAL, CKMB, CKMBINDEX, TROPONINI in the last 168 hours. BNP (last 3 results) No results for input(s): PROBNP in the last 8760 hours. HbA1C: No results for input(s): HGBA1C in the last 72 hours. CBG: No results for input(s): GLUCAP in the last 168 hours. Lipid Profile: No results for input(s): CHOL, HDL, LDLCALC, TRIG, CHOLHDL, LDLDIRECT in the last 72 hours. Thyroid Function Tests:  Recent Labs  05/05/16 0419  TSH 0.581   Anemia Panel: No results for input(s): VITAMINB12, FOLATE, FERRITIN, TIBC, IRON, RETICCTPCT in the last 72 hours. Sepsis Labs:  Recent Labs Lab 05/04/16 0218 05/04/16 0513 05/04/16 0838  PROCALCITON  --   --  4.76  LATICACIDVEN 3.1* 3.1*  --     Recent Results (from the past 240 hour(s))  Urine culture     Status: Abnormal   Collection Time: 05/04/16  2:18 AM  Result Value Ref Range Status   Specimen Description URINE, RANDOM  Final   Special Requests NONE  Final   Culture MULTIPLE SPECIES PRESENT, SUGGEST RECOLLECTION (A)  Final   Report Status 05/05/2016 FINAL  Final  Blood culture (routine x 2)     Status: None (Preliminary result)   Collection Time: 05/04/16  7:30 AM  Result Value Ref Range Status   Specimen Description BLOOD RIGHT HAND  Final   Special Requests BOTTLES DRAWN AEROBIC ONLY 5 ML  Final   Culture NO GROWTH 1 DAY  Final   Report Status PENDING  Incomplete  Blood culture (routine x 2)     Status: None (Preliminary result)    Collection Time: 05/04/16  7:35 AM  Result Value Ref Range Status   Specimen Description BLOOD LEFT ANTECUBITAL  Final   Special Requests BOTTLES DRAWN AEROBIC AND ANAEROBIC 5 ML  Final   Culture NO GROWTH 1 DAY  Final   Report Status PENDING  Incomplete         Radiology Studies: Dg Chest 2 View  Result Date: 05/04/2016 CLINICAL DATA:  81 year old female with sepsis and frequent urination and chills. EXAM: CHEST  2 VIEW COMPARISON:  Chest radiograph dated 11/09/2014 FINDINGS: There is shallow inspiration with minimal bibasilar atelectatic changes. Left lung base hazy density likely represent atelectatic changes and interstitial crowding. Pneumonia is less likely but not excluded. Clinical correlation is recommended. There is no pleural effusion or pneumothorax. Stable top-normal cardiac size. No acute osseous pathology identified. Right axillary surgical clips noted. IMPRESSION: Shallow inspiration and minimal  bibasilar atelectasis. Left lung base developing infiltrate is less likely but not excluded. Clinical correlation is recommended. Electronically Signed   By: Anner Crete M.D.   On: 05/04/2016 03:35   Dg Tibia/fibula Right  Result Date: 05/04/2016 CLINICAL DATA:  Cellulitis.  No injury. EXAM: RIGHT TIBIA AND FIBULA - 2 VIEW COMPARISON:  No recent. FINDINGS: Diffuse soft tissue swelling. No definite soft tissue air noted. Degenerative changes right knee and ankle. No focal bony abnormality. IMPRESSION: 1. Diffuse soft tissue swelling noted. No definite soft tissue air noted. No acute or focal bony abnormality. 2. Degenerative changes right knee and ankle. Electronically Signed   By: Marcello Moores  Register   On: 05/04/2016 08:04        Scheduled Meds: . clindamycin (CLEOCIN) IV  600 mg Intravenous Q8H  . hydroxyurea  500 mg Oral Q M,W,F  . metoprolol succinate  50 mg Oral Daily  . pravastatin  80 mg Oral QPM  . rivaroxaban  20 mg Oral Q supper   Continuous Infusions: . 0.9 % NaCl  with KCl 40 mEq / L 75 mL/hr (05/05/16 1000)     LOS: 1 day    Dron Tanna Furry, MD [facility] Pager [phone]  If 7PM-7AM, please contact night-coverage [email or web address] Password [facility] 05/05/2016, 2:41 PM

## 2016-05-05 NOTE — Progress Notes (Signed)
VASCULAR LAB PRELIMINARY  PRELIMINARY  PRELIMINARY  PRELIMINARY  Bilateral lower extremity venous duplex completed.    Preliminary report:  Bilateral:  No evidence of DVT, superficial thrombosis, or Baker's Cyst.   Selvin Yun, RVS 05/05/2016, 3:12 PM

## 2016-05-06 LAB — BASIC METABOLIC PANEL
Anion gap: 6 (ref 5–15)
BUN: 10 mg/dL (ref 6–20)
CALCIUM: 8.1 mg/dL — AB (ref 8.9–10.3)
CO2: 24 mmol/L (ref 22–32)
CREATININE: 0.94 mg/dL (ref 0.44–1.00)
Chloride: 109 mmol/L (ref 101–111)
GFR calc non Af Amer: 57 mL/min — ABNORMAL LOW (ref >60)
Glucose, Bld: 111 mg/dL — ABNORMAL HIGH (ref 65–99)
Potassium: 3.7 mmol/L (ref 3.5–5.1)
Sodium: 139 mmol/L (ref 135–145)

## 2016-05-06 LAB — CBC
HEMATOCRIT: 37.8 % (ref 36.0–46.0)
Hemoglobin: 12.1 g/dL (ref 12.0–15.0)
MCH: 29.3 pg (ref 26.0–34.0)
MCHC: 32 g/dL (ref 30.0–36.0)
MCV: 91.5 fL (ref 78.0–100.0)
PLATELETS: 352 10*3/uL (ref 150–400)
RBC: 4.13 MIL/uL (ref 3.87–5.11)
RDW: 14.7 % (ref 11.5–15.5)
WBC: 7.6 10*3/uL (ref 4.0–10.5)

## 2016-05-06 MED ORDER — CEPHALEXIN 500 MG PO CAPS: 500.0000 mg | ORAL_CAPSULE | Freq: Two times a day (BID) | ORAL | 0 refills | Status: AC

## 2016-05-06 MED ORDER — DOXYCYCLINE HYCLATE 50 MG PO CAPS: 100.0000 mg | ORAL_CAPSULE | Freq: Two times a day (BID) | ORAL | 0 refills | Status: AC

## 2016-05-06 NOTE — Progress Notes (Signed)
NURSING PROGRESS NOTE  Emer Onnen [id number] Discharge Data: 05/06/2016 2:51 PM Attending Provider: Rosita Fire, MD MGQ:[id number] Monia Pouch, MD   Brantley Fling to be D/C'd Home per MD order.    All IV's will be discontinued and monitored for bleeding.  All belongings will be returned to patient for patient to take home.  Last Documented Vital Signs:  Blood pressure (!) 119/55, pulse 77, temperature 99 F (37.2 C), resp. rate 18, height 5\' 5"  (1.651 m), weight 106.4 kg (234 lb 9.6 oz), SpO2 97 %.  Joslyn Hy, MSN, RN, [employer]

## 2016-05-06 NOTE — Evaluation (Signed)
Physical Therapy Evaluation Patient Details Name: Lenae Wherley MRN: [id number] DOB: [birth year] Today's Date: 05/06/2016   History of Present Illness  Patient is a 81 yo female admitted 05/04/16 with general weakness, RLE pain and redness.  Patient with RLE cellulitis.  [place] negative for DVT.    PMH:  HTN, recurrent DVT's, CHF, HLD.  Clinical Impression  Patient is functioning at supervision to Mod I level with all mobility and gait.  No loss of balance during gait.  No further PT needs identified - PT will sign off.  Patient to d/c home today.    Follow Up Recommendations No PT follow up;Supervision - Intermittent    Equipment Recommendations  None recommended by PT    Recommendations for Other Services       Precautions / Restrictions Precautions Precautions: None Restrictions Weight Bearing Restrictions: No      Mobility  Bed Mobility               General bed mobility comments: Patient in recliner  Transfers Overall transfer level: Independent Equipment used: None             General transfer comment: Patient using correct hand placement and stands with no assist.  Patient also able to stand from recliner with no UE assist.  Ambulation/Gait Ambulation/Gait assistance: Modified independent (Device/Increase time) Ambulation Distance (Feet): 40 Feet Assistive device: None Gait Pattern/deviations: Step-through pattern;Decreased step length - right;Decreased step length - left;Decreased stance time - right;Decreased stride length;Decreased weight shift to right;Antalgic Gait velocity: decreased Gait velocity interpretation: Below normal speed for age/gender General Gait Details: Patient with slow, steady gait, antalgic on RLE.  No loss of balance during gait.  Stairs            Wheelchair Mobility    Modified Rankin (Stroke Patients Only)       Balance Overall balance assessment: Modified Independent                                            Pertinent Vitals/Pain Pain Assessment: 0-10 Pain Score: 6  Pain Location: RLE Pain Descriptors / Indicators: Sore;Tender Pain Intervention(s): Monitored during session    Home Living Family/patient expects to be discharged to:: Private residence Living Arrangements: Spouse/significant other Available Help at Discharge: Family;Available PRN/intermittently (Husband teaches at [employer], daughter and sister nearby) Type of Home: House Home Access: Stairs to enter Entrance Stairs-Rails: Right Entrance Stairs-Number of Steps: 3 Home Layout: One level Home Equipment: None      Prior Function Level of Independence: Independent         Comments: Drives     Hand Dominance        Extremity/Trunk Assessment   Upper Extremity Assessment Upper Extremity Assessment: Overall WFL for tasks assessed    Lower Extremity Assessment Lower Extremity Assessment: Overall WFL for tasks assessed;RLE deficits/detail RLE Deficits / Details: Edema noted lower leg and foot.  Strength 4+/5 with pain limiting       Communication   Communication: No difficulties  Cognition Arousal/Alertness: Awake/alert Behavior During Therapy: WFL for tasks assessed/performed Overall Cognitive Status: Within Functional Limits for tasks assessed                                        General Comments  Exercises     Assessment/Plan    PT Assessment Patent does not need any further PT services  PT Problem List         PT Treatment Interventions      PT Goals (Current goals can be found in the Care Plan section)  Acute Rehab PT Goals Patient Stated Goal: To go home PT Goal Formulation: All assessment and education complete, DC therapy    Frequency     Barriers to discharge        Co-evaluation               End of Session Equipment Utilized During Treatment: Gait belt Activity Tolerance: Patient tolerated treatment well;Patient limited by  pain Patient left: in chair;with call bell/phone within reach;with family/visitor present Nurse Communication: Mobility status (No PT needs) PT Visit Diagnosis: Difficulty in walking, not elsewhere classified (R26.2);Pain Pain - Right/Left: Right Pain - part of body: Leg    Time: 1401-1415 PT Time Calculation (min) (ACUTE ONLY): 14 min   Charges:   PT Evaluation $PT Eval Low Complexity: 1 Procedure     PT G Codes:        Carita Pian. Sanjuana Kava, [facility] Acute Rehab Services Pager [place] 05/06/2016, 3:29 PM

## 2016-05-06 NOTE — Care Management Note (Signed)
Case Management Note  Patient Details  Name: Lori Page MRN: [id number] Date of Birth: 1936/01/12  Subjective/Objective:                 Patient with order to DC to home. No CM consult, orders, or needs identified at this time.    Action/Plan:  DC to home self care.  Expected Discharge Date:  05/06/16               Expected Discharge Plan:  Home/Self Care  In-House Referral:     Discharge planning Services  CM Consult  Post Acute Care Choice:    Choice offered to:     DME Arranged:    DME Agency:     HH Arranged:    HH Agency:     Status of Service:  Completed, signed off  If discussed at [employer] of Stay Meetings, dates discussed:    Additional Comments:  Carles Collet, RN 05/06/2016, 2:11 PM

## 2016-05-06 NOTE — Discharge Summary (Addendum)
Physician Discharge Summary  Randye Treichler ZWC:[id number] DOB: 30-Jan-1936 DOA: 05/04/2016  PCP: Horatio Pel, MD  Admit date: 05/04/2016 Discharge date: 05/06/2016  Admitted From:Home Disposition:home  Recommendations for Outpatient Follow-up:  1. Follow up with PCP in 1-2 weeks 2. Please obtain BMP/CBC in one week Home Health:no. Patient lives with her family Equipment/Devices:no Discharge Condition:stabke CODE STATUS:full Diet recommendation:heart healthy  Brief/Interim Summary:80 y.o.femalewith medical history significant of breast cancer, thrombocytosis, HTN, Recurrent DVTs. Presenting with several week history of muscle aches and general fatigue. Patient also endorses right lower extremity swelling and redness with tenderness to touch in certain movements. All of patient's symptoms became significantly worse over the last day.  # Right lower extremity cellulitis: Patient with fever, leukocytosis, elevated lactate which is concerning for possible mild sepsis on admission. Treated with IV clindamycin With mild improvement in cellulitis. Redness has improved. Doppler ultrasound negative for DVT. Patient will be discharged with oral Keflex and doxycycline for 5 more days. Recommended to follow-up with PCP. Patient was educated to elevate the leg. -Patient stated she lives with the family.  #History of DVT: Continue xarelto. Doppler ultrasound of lower extremities negative for DVT.  #Hypertension: Continue metoprolol. Monitor blood pressure.  #Dyslipidemia: Continue statin  #Mild thrombocytosis: Currently on hydroxyurea. Monitor platelet count.  #Mild hypokalemia: Improved. Recommended to monitor labs.  #Chronic diastolic congestive heart failure:Last echo from 2016 showing EF of 55-60% and grade 1 diastolic dysfunction. Currently no evidence of decompensation.  I do not think patient has acute kidney injury but she meets criteria for CKD stage 2.   Patient is  clinically improved. Right lower extremity cellulitis can be treated with oral antibiotics with PCP follow-up. Patient denied fever, chills, nausea or vomiting.   Discharge Diagnoses:  Active Problems:   Essential thrombocytosis (HCC)   Sepsis due to undetermined organism [facility])   Essential hypertension   Dyslipidemia   Chronic diastolic CHF (congestive heart failure) (HCC)   Cellulitis of right lower extremity   DVT, lower extremity, recurrent [facility])       Discharge Instructions  Discharge Instructions    Call MD for:  difficulty breathing, headache or visual disturbances    Complete by:  As directed    Call MD for:  extreme fatigue    Complete by:  As directed    Call MD for:  hives    Complete by:  As directed    Call MD for:  persistant dizziness or light-headedness    Complete by:  As directed    Call MD for:  persistant nausea and vomiting    Complete by:  As directed    Call MD for:  severe uncontrolled pain    Complete by:  As directed    Call MD for:  temperature >100.4    Complete by:  As directed    Diet - low sodium heart healthy    Complete by:  As directed    Discharge instructions    Complete by:  As directed    Please elevate leg,  Please take yogurt or probiotics while on abx,  Follow up with your PCP in 1-2 weeks.   Increase activity slowly    Complete by:  As directed      Allergies as of 05/06/2016   No Known Allergies     Medication List    TAKE these medications   calcium carbonate 600 MG Tabs tablet Commonly known as:  OS-CAL Take 600 mg by mouth daily.   cephALEXin 500 MG  capsule Commonly known as:  KEFLEX Take 1 capsule (500 mg total) by mouth 2 (two) times daily.   doxycycline 50 MG capsule Commonly known as:  VIBRAMYCIN Take 2 capsules (100 mg total) by mouth 2 (two) times daily.   hydroxyurea 500 MG capsule Commonly known as:  HYDREA Take 1 capsule (500 mg total) by mouth 3 (three) times a week. May take with food to minimize  GI side effects. What changed:  when to take this  additional instructions  Another medication with the same name was removed. Continue taking this medication, and follow the directions you see here.   metoprolol succinate 50 MG 24 hr tablet Commonly known as:  TOPROL-XL Take 1 tablet (50 mg total) by mouth daily. Take with or immediately following a meal.   pravastatin 80 MG tablet Commonly known as:  PRAVACHOL Take 80 mg by mouth every evening.   rivaroxaban 20 MG Tabs tablet Commonly known as:  XARELTO Take 20 mg by mouth daily with supper.      Follow-up Information    Horatio Pel, MD. Schedule an appointment as soon as possible for a visit in 1 week(s).   Specialty:  Internal Medicine Contact information: [address] [place] [address] [phone]          No Known Allergies  Consultations: None  Procedures/Studies: Ultrasound of lower extremities  Subjective: Patient was seen and examined at bedside. She reported doing well. Denied headache, dizziness, nausea, vomiting, chest pain, shortness of breath. Reported that right lower extremity pain is better.  Discharge Exam: Vitals:   05/05/16 2115 05/06/16 0531  BP: 117/62 (!) 119/55  Pulse: 81 77  Resp: 18 18  Temp: 98.9 F (37.2 C) 99 F (37.2 C)   Vitals:   05/05/16 1748 05/05/16 2115 05/06/16 0527 05/06/16 0531  BP: 123/63 117/62  (!) 119/55  Pulse: 75 81  77  Resp: 16 18  18   Temp: 98.9 F (37.2 C) 98.9 F (37.2 C)  99 F (37.2 C)  TempSrc: Oral     SpO2: 98% 99%  97%  Weight:   106.4 kg (234 lb 9.6 oz)   Height:        General: Pt is alert, awake, not in acute distress Cardiovascular: RRR, S1/S2 +, no rubs, no gallops Respiratory: CTA bilaterally, no wheezing, no rhonchi Abdominal: Soft, NT, ND, bowel sounds + Extremities: Right lower extremity redness and edema is mildly improved.    The results of significant diagnostics from this hospitalization  (including imaging, microbiology, ancillary and laboratory) are listed below for reference.     Microbiology: Recent Results (from the past 240 hour(s))  Urine culture     Status: Abnormal   Collection Time: 05/04/16  2:18 AM  Result Value Ref Range Status   Specimen Description URINE, RANDOM  Final   Special Requests NONE  Final   Culture MULTIPLE SPECIES PRESENT, SUGGEST RECOLLECTION (A)  Final   Report Status 05/05/2016 FINAL  Final  Blood culture (routine x 2)     Status: None (Preliminary result)   Collection Time: 05/04/16  7:30 AM  Result Value Ref Range Status   Specimen Description BLOOD RIGHT HAND  Final   Special Requests BOTTLES DRAWN AEROBIC ONLY 5 ML  Final   Culture NO GROWTH 2 DAYS  Final   Report Status PENDING  Incomplete  Blood culture (routine x 2)     Status: None (Preliminary result)   Collection Time: 05/04/16  7:35 AM  Result Value Ref Range Status   Specimen Description BLOOD LEFT ANTECUBITAL  Final   Special Requests BOTTLES DRAWN AEROBIC AND ANAEROBIC 5 ML  Final   Culture NO GROWTH 2 DAYS  Final   Report Status PENDING  Incomplete     Labs: BNP (last 3 results) No results for input(s): BNP in the last 8760 hours. Basic Metabolic Panel:  Recent Labs Lab 05/03/16 2308 05/05/16 0419 05/06/16 0358  NA 138 138 139  K 3.7 3.2* 3.7  CL 102 108 109  CO2 23 23 24   GLUCOSE 146* 121* 111*  BUN 13 11 10   CREATININE 1.19* 0.97 0.94  CALCIUM 9.2 7.8* 8.1*   Liver Function Tests:  Recent Labs Lab 05/03/16 2308 05/05/16 0419  AST 32 33  ALT 21 17  ALKPHOS 58 47  BILITOT 0.8 0.7  PROT 7.4 5.6*  ALBUMIN 3.9 2.4*   No results for input(s): LIPASE, AMYLASE in the last 168 hours. No results for input(s): AMMONIA in the last 168 hours. CBC:  Recent Labs Lab 05/03/16 2308 05/05/16 0419 05/06/16 0358  WBC 15.2* 11.2* 7.6  NEUTROABS 13.4*  --   --   HGB 15.6* 12.5 12.1  HCT 46.6* 38.4 37.8  MCV 91.2 91.2 91.5  PLT 452* 337 352   Cardiac  Enzymes: No results for input(s): CKTOTAL, CKMB, CKMBINDEX, TROPONINI in the last 168 hours. BNP: Invalid input(s): POCBNP CBG: No results for input(s): GLUCAP in the last 168 hours. D-Dimer No results for input(s): DDIMER in the last 72 hours. Hgb A1c No results for input(s): HGBA1C in the last 72 hours. Lipid Profile No results for input(s): CHOL, HDL, LDLCALC, TRIG, CHOLHDL, LDLDIRECT in the last 72 hours. Thyroid function studies  Recent Labs  05/05/16 0419  TSH 0.581   Anemia work up No results for input(s): VITAMINB12, FOLATE, FERRITIN, TIBC, IRON, RETICCTPCT in the last 72 hours. Urinalysis    Component Value Date/Time   COLORURINE YELLOW 05/04/2016 0218   APPEARANCEUR HAZY (A) 05/04/2016 0218   LABSPEC 1.021 05/04/2016 0218   PHURINE 5.0 05/04/2016 0218   GLUCOSEU NEGATIVE 05/04/2016 0218   HGBUR NEGATIVE 05/04/2016 0218   BILIRUBINUR NEGATIVE 05/04/2016 0218   KETONESUR NEGATIVE 05/04/2016 0218   PROTEINUR 30 (A) 05/04/2016 0218   UROBILINOGEN 0.2 11/09/2014 1051   NITRITE NEGATIVE 05/04/2016 0218   LEUKOCYTESUR NEGATIVE 05/04/2016 0218   Sepsis Labs Invalid input(s): PROCALCITONIN,  WBC,  LACTICIDVEN Microbiology Recent Results (from the past 240 hour(s))  Urine culture     Status: Abnormal   Collection Time: 05/04/16  2:18 AM  Result Value Ref Range Status   Specimen Description URINE, RANDOM  Final   Special Requests NONE  Final   Culture MULTIPLE SPECIES PRESENT, SUGGEST RECOLLECTION (A)  Final   Report Status 05/05/2016 FINAL  Final  Blood culture (routine x 2)     Status: None (Preliminary result)   Collection Time: 05/04/16  7:30 AM  Result Value Ref Range Status   Specimen Description BLOOD RIGHT HAND  Final   Special Requests BOTTLES DRAWN AEROBIC ONLY 5 ML  Final   Culture NO GROWTH 2 DAYS  Final   Report Status PENDING  Incomplete  Blood culture (routine x 2)     Status: None (Preliminary result)   Collection Time: 05/04/16  7:35 AM  Result  Value Ref Range Status   Specimen Description BLOOD LEFT ANTECUBITAL  Final   Special Requests BOTTLES DRAWN AEROBIC AND ANAEROBIC 5 ML  Final  Culture NO GROWTH 2 DAYS  Final   Report Status PENDING  Incomplete     Time coordinating discharge: 26 minutes  SIGNED:   Rosita Fire, MD  [facility] 05/06/2016, 1:11 PM  If 7PM-7AM, please contact night-coverage [email or web address] Password [email or web address]

## 2016-05-09 LAB — CULTURE, BLOOD (ROUTINE X 2)
CULTURE: NO GROWTH
Culture: NO GROWTH

## 2016-05-18 DIAGNOSIS — Z09 Encounter for follow-up examination after completed treatment for conditions other than malignant neoplasm: Secondary | ICD-10-CM | POA: Diagnosis not present

## 2016-05-23 DIAGNOSIS — Z86718 Personal history of other venous thrombosis and embolism: Secondary | ICD-10-CM | POA: Diagnosis not present

## 2016-05-23 DIAGNOSIS — Z79899 Other long term (current) drug therapy: Secondary | ICD-10-CM | POA: Diagnosis not present

## 2016-05-23 DIAGNOSIS — L03115 Cellulitis of right lower limb: Secondary | ICD-10-CM | POA: Diagnosis not present

## 2016-05-24 ENCOUNTER — Ambulatory Visit
Admission: RE | Admit: 2016-05-24 | Discharge: 2016-05-24 | Disposition: A | Discharge status: Home / Self Care | Payer: Medicare Other | Source: Ambulatory Visit | Attending: Internal Medicine | Admitting: Internal Medicine

## 2016-05-24 DIAGNOSIS — Z1231 Encounter for screening mammogram for malignant neoplasm of breast: Secondary | ICD-10-CM

## 2016-05-24 DIAGNOSIS — Z9011 Acquired absence of right breast and nipple: Secondary | ICD-10-CM

## 2016-06-07 DIAGNOSIS — L03115 Cellulitis of right lower limb: Secondary | ICD-10-CM | POA: Diagnosis not present

## 2017-01-10 DIAGNOSIS — I10 Essential (primary) hypertension: Secondary | ICD-10-CM | POA: Diagnosis not present

## 2017-01-10 DIAGNOSIS — Z0000 Encounter for general adult medical examination without abnormal findings: Secondary | ICD-10-CM | POA: Diagnosis not present

## 2017-01-10 DIAGNOSIS — Z23 Encounter for immunization: Secondary | ICD-10-CM | POA: Diagnosis not present

## 2017-01-10 DIAGNOSIS — E782 Mixed hyperlipidemia: Secondary | ICD-10-CM | POA: Diagnosis not present

## 2017-01-10 DIAGNOSIS — E559 Vitamin D deficiency, unspecified: Secondary | ICD-10-CM | POA: Diagnosis not present

## 2017-01-10 DIAGNOSIS — E039 Hypothyroidism, unspecified: Secondary | ICD-10-CM | POA: Diagnosis not present

## 2017-01-16 DIAGNOSIS — Z0001 Encounter for general adult medical examination with abnormal findings: Secondary | ICD-10-CM | POA: Diagnosis not present

## 2017-01-16 DIAGNOSIS — I4510 Unspecified right bundle-branch block: Secondary | ICD-10-CM | POA: Diagnosis not present

## 2017-01-16 DIAGNOSIS — Z789 Other specified health status: Secondary | ICD-10-CM | POA: Diagnosis not present

## 2017-01-16 DIAGNOSIS — E039 Hypothyroidism, unspecified: Secondary | ICD-10-CM | POA: Diagnosis not present

## 2017-02-20 NOTE — Assessment & Plan Note (Addendum)
Stage I right breast cancer diagnosed in 1984: Status post mastectomy, adjuvant chemotherapy and adjuvant antiestrogen therapy. Currently on surveillance  Breast Cancer Surveillance: 1. Breast exam 02/20/2017 2. Mammogram 05/24/2016: No abnormalities. Postsurgical changes. Breast Density Category B.  Return to clinic in 1 year for follow-up

## 2017-02-21 ENCOUNTER — Inpatient Hospital Stay: Payer: Medicare Other | Attending: Hematology and Oncology | Admitting: Hematology and Oncology

## 2017-02-21 ENCOUNTER — Inpatient Hospital Stay: Payer: Medicare Other

## 2017-02-21 ENCOUNTER — Telehealth: Payer: Self-pay | Admitting: Hematology and Oncology

## 2017-02-21 DIAGNOSIS — D473 Essential (hemorrhagic) thrombocythemia: Secondary | ICD-10-CM | POA: Diagnosis not present

## 2017-02-21 DIAGNOSIS — Z9010 Acquired absence of unspecified breast and nipple: Secondary | ICD-10-CM | POA: Insufficient documentation

## 2017-02-21 DIAGNOSIS — Z853 Personal history of malignant neoplasm of breast: Secondary | ICD-10-CM | POA: Diagnosis not present

## 2017-02-21 DIAGNOSIS — C50311 Malignant neoplasm of lower-inner quadrant of right female breast: Secondary | ICD-10-CM

## 2017-02-21 DIAGNOSIS — Z170 Estrogen receptor positive status [ER+]: Secondary | ICD-10-CM | POA: Insufficient documentation

## 2017-02-21 DIAGNOSIS — Z9221 Personal history of antineoplastic chemotherapy: Secondary | ICD-10-CM | POA: Insufficient documentation

## 2017-02-21 LAB — CBC WITH DIFFERENTIAL/PLATELET
ABS GRANULOCYTE: 3.6 10*3/uL (ref 1.5–6.5)
BASOS ABS: 0.1 10*3/uL (ref 0.0–0.1)
Basophils Relative: 1 %
EOS PCT: 3 %
Eosinophils Absolute: 0.2 10*3/uL (ref 0.0–0.5)
HCT: 47.1 % — ABNORMAL HIGH (ref 34.8–46.6)
HEMOGLOBIN: 15.4 g/dL (ref 11.6–15.9)
LYMPHS ABS: 1.7 10*3/uL (ref 0.9–3.3)
LYMPHS PCT: 28 %
MCH: 29.1 pg (ref 25.1–34.0)
MCHC: 32.6 g/dL (ref 31.5–36.0)
MCV: 89.1 fL (ref 79.5–101.0)
Monocytes Absolute: 0.6 10*3/uL (ref 0.1–0.9)
Monocytes Relative: 10 %
NEUTROS ABS: 3.6 10*3/uL (ref 1.5–6.5)
NEUTROS PCT: 58 %
PLATELETS: 535 10*3/uL — AB (ref 145–400)
RBC: 5.29 MIL/uL (ref 3.70–5.45)
RDW: 15.9 % (ref 11.2–16.1)
WBC: 6.1 10*3/uL (ref 3.9–10.3)

## 2017-02-21 MED ORDER — HYDROXYUREA 500 MG PO CAPS: 500.0000 mg | ORAL_CAPSULE | ORAL | Status: DC

## 2017-02-21 NOTE — Progress Notes (Signed)
Patient Care Team: Deland Pretty, MD as PCP - General (Internal Medicine)  DIAGNOSIS:  Encounter Diagnoses  Name Primary?  . Malignant neoplasm of lower-inner quadrant of right breast of female, estrogen receptor positive ([place])   . Essential thrombocytosis ([place])     SUMMARY OF ONCOLOGIC HISTORY:   Breast cancer of lower-inner quadrant of right female breast ([place])   09/23/1982 Surgery    Right mastectomy foll by reconstruction; Stage 1      10/24/1982 - 03/26/1983 Chemotherapy    Chemotherapy X 6 months      04/23/1983 - 10/23/1988 Anti-estrogen oral therapy    Tamoxifen 20 mg daily       Essential thrombocytosis ([place])   04/26/2010 Procedure    Bone marrow biopsy: Hypercellular with features of myeloproliferative neoplasm differential diagnosis ET vs P. vera      04/26/2010 -  Chemotherapy    Hydrea 500 mg twice daily; later changed to alternate once daily and twice daily (due to leg swelling); changed to once daily 10/24/13; decrease to 3 times a week on 02/22/2016       CHIEF COMPLIANT: Follow-up of essential thrombocytosis  INTERVAL HISTORY: Lori Page is a 82 year old with above-mentioned history of essential thrombocytosis who is on currently taking Hydrea.  She takes it Monday Wednesday Friday.  She has been doing reasonably well.  She has had upper respiratory infections but I do not believe they are related to hydroxyurea.  She has been tolerating it extremely well.  Denies any nausea issues.  She is also here to follow-up on breast cancer and denies any lumps or nodules in the breast  REVIEW OF SYSTEMS:   Constitutional: Denies fevers, chills or abnormal weight loss Eyes: Denies blurriness of vision Ears, nose, mouth, throat, and face: Denies mucositis or sore throat Respiratory: Denies cough, dyspnea or wheezes Cardiovascular: Denies palpitation, chest discomfort Gastrointestinal:  Denies nausea, heartburn or change in bowel habits Skin: Denies abnormal skin  rashes Lymphatics: Denies new lymphadenopathy or easy bruising Neurological:Denies numbness, tingling or new weaknesses Behavioral/Psych: Mood is stable, no new changes  Extremities: No lower extremity edema Breast:  denies any pain or lumps or nodules in either breasts All other systems were reviewed with the patient and are negative.  I have reviewed the past medical history, past surgical history, social history and family history with the patient and they are unchanged from previous note.  ALLERGIES:  has No Known Allergies.  MEDICATIONS:  Current Outpatient Medications  Medication Sig Dispense Refill  . calcium carbonate (OS-CAL) 600 MG TABS Take 600 mg by mouth daily.    . hydroxyurea (HYDREA) 500 MG capsule Take 1 capsule (500 mg total) by mouth 3 (three) times a week. May take with food to minimize GI side effects. (Patient taking differently: Take 500 mg by mouth every Monday, Wednesday, and Friday. ) 30 capsule 11  . metoprolol succinate (TOPROL-XL) 50 MG 24 hr tablet Take 1 tablet (50 mg total) by mouth daily. Take with or immediately following a meal. 30 tablet 3  . pravastatin (PRAVACHOL) 80 MG tablet Take 80 mg by mouth every evening.   1  . rivaroxaban (XARELTO) 20 MG TABS tablet Take 20 mg by mouth daily with supper.     No current facility-administered medications for this visit.     PHYSICAL EXAMINATION: ECOG PERFORMANCE STATUS: 0 - Asymptomatic  Vitals:   02/21/17 0854  BP: (!) 150/82  Pulse: 61  Resp: 18  Temp: 97.7 F (36.5 C)  SpO2: 97%   Filed Weights   02/21/17 0854  Weight: 225 lb 11.2 oz (102.4 kg)    GENERAL:alert, no distress and comfortable SKIN: skin color, texture, turgor are normal, no rashes or significant lesions EYES: normal, Conjunctiva are pink and non-injected, sclera clear OROPHARYNX:no exudate, no erythema and lips, buccal mucosa, and tongue normal  NECK: supple, thyroid normal size, non-tender, without nodularity LYMPH:  no  palpable lymphadenopathy in the cervical, axillary or inguinal LUNGS: clear to auscultation and percussion with normal breathing effort HEART: regular rate & rhythm and no murmurs and no lower extremity edema ABDOMEN:abdomen soft, non-tender and normal bowel sounds MUSCULOSKELETAL:no cyanosis of digits and no clubbing  NEURO: alert & oriented x 3 with fluent speech, no focal motor/sensory deficits EXTREMITIES: No lower extremity edema BREAST: No palpable masses or nodules in either right or left breasts. No palpable axillary supraclavicular or infraclavicular adenopathy no breast tenderness or nipple discharge. (exam performed in the presence of a chaperone)  LABORATORY DATA:  I have reviewed the data as listed CMP Latest Ref Rng & Units 05/06/2016 05/05/2016 05/03/2016  Glucose 65 - 99 mg/dL 111(H) 121(H) 146(H)  BUN 6 - 20 mg/dL '10 11 13  '$ Creatinine 0.44 - 1.00 mg/dL 0.94 0.97 1.19(H)  Sodium 135 - 145 mmol/L 139 138 138  Potassium 3.5 - 5.1 mmol/L 3.7 3.2(L) 3.7  Chloride 101 - 111 mmol/L 109 108 102  CO2 22 - 32 mmol/L '24 23 23  '$ Calcium 8.9 - 10.3 mg/dL 8.1(L) 7.8(L) 9.2  Total Protein 6.5 - 8.1 g/dL - 5.6(L) 7.4  Total Bilirubin 0.3 - 1.2 mg/dL - 0.7 0.8  Alkaline Phos 38 - 126 U/L - 47 58  AST 15 - 41 U/L - 33 32  ALT 14 - 54 U/L - 17 21    Lab Results  Component Value Date   WBC 6.1 02/21/2017   HGB 15.4 02/21/2017   HCT 47.1 (H) 02/21/2017   MCV 89.1 02/21/2017   PLT 535 (H) 02/21/2017   NEUTROABS 3.6 02/21/2017    ASSESSMENT & PLAN:  Breast cancer of lower-inner quadrant of right female breast Stage I right breast cancer diagnosed in 1984: Status post mastectomy, adjuvant chemotherapy and adjuvant antiestrogen therapy. Currently on surveillance  Breast Cancer Surveillance: 1. Breast exam 02/20/2017 2. Mammogram 05/24/2016: No abnormalities. Postsurgical changes. Breast Density Category B.  Return to clinic in 1 year for follow-up  Essential thrombocytosis  ([place]) Well-controlled on hydroxyurea. Tolerating Hydrea very well. Blood counts were reviewed and they showed a platelet count of 535.  As long as the platelet count is below 700, we do not have to be overly concerned.  I believe it does fluctuate up and down over time. Continue with anticoagulation for history of recurrent blood clots.   I spent 25 minutes talking to the patient of which more than half was spent in counseling and coordination of care.  No orders of the defined types were placed in this encounter.  The patient has a good understanding of the overall plan. she agrees with it. she will call with any problems that may develop before the next visit here.   Harriette Ohara, MD 02/21/17

## 2017-02-21 NOTE — Assessment & Plan Note (Signed)
Well-controlled on hydroxyurea. Tolerating Hydrea very well. Blood counts were reviewed and they were normal. Continue with anticoagulation for history of recurrent blood clots.

## 2017-02-21 NOTE — Telephone Encounter (Signed)
Patient declined AVS and calendar of upcoming appointments. Patient will receive update in MyChart.  °

## 2017-04-16 DIAGNOSIS — Z6839 Body mass index (BMI) 39.0-39.9, adult: Secondary | ICD-10-CM | POA: Diagnosis not present

## 2017-04-16 DIAGNOSIS — I5032 Chronic diastolic (congestive) heart failure: Secondary | ICD-10-CM | POA: Diagnosis not present

## 2017-04-16 DIAGNOSIS — I10 Essential (primary) hypertension: Secondary | ICD-10-CM | POA: Diagnosis not present

## 2017-04-16 DIAGNOSIS — R600 Localized edema: Secondary | ICD-10-CM | POA: Diagnosis not present

## 2017-04-23 ENCOUNTER — Other Ambulatory Visit: Payer: Self-pay | Admitting: Internal Medicine

## 2017-04-23 DIAGNOSIS — Z139 Encounter for screening, unspecified: Secondary | ICD-10-CM

## 2017-04-23 DIAGNOSIS — H52223 Regular astigmatism, bilateral: Secondary | ICD-10-CM | POA: Diagnosis not present

## 2017-05-04 ENCOUNTER — Other Ambulatory Visit: Payer: Self-pay | Admitting: Hematology and Oncology

## 2017-05-25 ENCOUNTER — Ambulatory Visit
Admission: RE | Admit: 2017-05-25 | Discharge: 2017-05-25 | Disposition: A | Discharge status: Home / Self Care | Payer: Medicare Other | Source: Ambulatory Visit | Attending: Internal Medicine | Admitting: Internal Medicine

## 2017-05-25 DIAGNOSIS — Z1231 Encounter for screening mammogram for malignant neoplasm of breast: Secondary | ICD-10-CM | POA: Diagnosis not present

## 2017-05-25 DIAGNOSIS — Z139 Encounter for screening, unspecified: Secondary | ICD-10-CM

## 2017-08-20 DIAGNOSIS — R7303 Prediabetes: Secondary | ICD-10-CM | POA: Diagnosis not present

## 2017-08-20 DIAGNOSIS — L918 Other hypertrophic disorders of the skin: Secondary | ICD-10-CM | POA: Diagnosis not present

## 2017-10-19 DIAGNOSIS — Z23 Encounter for immunization: Secondary | ICD-10-CM | POA: Diagnosis not present

## 2018-01-08 DIAGNOSIS — I10 Essential (primary) hypertension: Secondary | ICD-10-CM | POA: Diagnosis not present

## 2018-01-15 DIAGNOSIS — R05 Cough: Secondary | ICD-10-CM | POA: Diagnosis not present

## 2018-01-15 DIAGNOSIS — Z0000 Encounter for general adult medical examination without abnormal findings: Secondary | ICD-10-CM | POA: Diagnosis not present

## 2018-01-15 DIAGNOSIS — I4510 Unspecified right bundle-branch block: Secondary | ICD-10-CM | POA: Diagnosis not present

## 2018-01-15 DIAGNOSIS — E039 Hypothyroidism, unspecified: Secondary | ICD-10-CM | POA: Diagnosis not present

## 2018-01-15 DIAGNOSIS — Z86711 Personal history of pulmonary embolism: Secondary | ICD-10-CM | POA: Diagnosis not present

## 2018-01-15 DIAGNOSIS — M8589 Other specified disorders of bone density and structure, multiple sites: Secondary | ICD-10-CM | POA: Diagnosis not present

## 2018-01-22 DIAGNOSIS — M8580 Other specified disorders of bone density and structure, unspecified site: Secondary | ICD-10-CM | POA: Diagnosis not present

## 2018-01-22 DIAGNOSIS — Z86718 Personal history of other venous thrombosis and embolism: Secondary | ICD-10-CM | POA: Diagnosis not present

## 2018-01-30 ENCOUNTER — Ambulatory Visit: Payer: Medicare Other | Admitting: Internal Medicine

## 2018-02-18 ENCOUNTER — Telehealth: Payer: Self-pay | Admitting: Hematology and Oncology

## 2018-02-18 NOTE — Telephone Encounter (Signed)
Spoke with patient and confirmed new appt time.

## 2018-02-19 NOTE — Progress Notes (Signed)
Patient Care Team: Deland Pretty, MD as PCP - General (Internal Medicine)  DIAGNOSIS:    ICD-10-CM   1. Essential thrombocytosis ([facility]) D47.3 CBC with Differential ([place] Only)  2. Malignant neoplasm of lower-inner quadrant of right breast of female, estrogen receptor positive ([place]) C50.311    Z17.0     SUMMARY OF ONCOLOGIC HISTORY:   Breast cancer of lower-inner quadrant of right female breast ([place])   09/23/1982 Surgery    Right mastectomy foll by reconstruction; Stage 1    10/24/1982 - 03/26/1983 Chemotherapy    Chemotherapy X 6 months    04/23/1983 - 10/23/1988 Anti-estrogen oral therapy    Tamoxifen 20 mg daily     Essential thrombocytosis ([place])   04/26/2010 Procedure    Bone marrow biopsy: Hypercellular with features of myeloproliferative neoplasm differential diagnosis ET vs P. vera    04/26/2010 -  Chemotherapy    Hydrea 500 mg twice daily; later changed to alternate once daily and twice daily (due to leg swelling); changed to once daily 10/24/13; decrease to 3 times a week on 02/22/2016     CHIEF COMPLIANT: Follow-up of essential thrombocytosis  INTERVAL HISTORY: Lori Page is a 83 y.o. with above-mentioned history of right breast cancer and essential thrombocytosis who is on currently taking Hydrea. I last saw the patient one year ago. Her most recent mammogram from 05/25/17 showed no evidence of malignancy in the left breast. She presents to the clinic alone today and is doing well. She denies any side effects of the Hydrea. Her lab work today show platelets 515, Hg 15.3. She denies any pain or discomfort in her breasts.   REVIEW OF SYSTEMS:   Constitutional: Denies fevers, chills or abnormal weight loss Eyes: Denies blurriness of vision Ears, nose, mouth, throat, and face: Denies mucositis or sore throat Respiratory: Denies cough, dyspnea or wheezes Cardiovascular: Denies palpitation, chest discomfort Gastrointestinal:  Denies nausea, heartburn or change  in bowel habits Skin: Denies abnormal skin rashes Lymphatics: Denies new lymphadenopathy or easy bruising Neurological:Denies numbness, tingling or new weaknesses Behavioral/Psych: Mood is stable, no new changes  Extremities: No lower extremity edema Breast: denies any pain or lumps or nodules in either breasts All other systems were reviewed with the patient and are negative.  I have reviewed the past medical history, past surgical history, social history and family history with the patient and they are unchanged from previous note.  ALLERGIES:  has No Known Allergies.  MEDICATIONS:  Current Outpatient Medications  Medication Sig Dispense Refill  . calcium carbonate (OS-CAL) 600 MG TABS Take 600 mg by mouth daily.    . hydroxyurea (HYDREA) 500 MG capsule Take 1 capsule (500 mg total) by mouth 3 (three) times a week. 36 capsule 3  . metoprolol succinate (TOPROL-XL) 50 MG 24 hr tablet Take 1 tablet (50 mg total) by mouth daily. Take with or immediately following a meal. 30 tablet 3  . pravastatin (PRAVACHOL) 80 MG tablet Take 80 mg by mouth every evening.   1  . rivaroxaban (XARELTO) 20 MG TABS tablet Take 20 mg by mouth daily with supper.     No current facility-administered medications for this visit.     PHYSICAL EXAMINATION: ECOG PERFORMANCE STATUS: 0 - Asymptomatic  Vitals:   02/20/18 1400  BP: (!) 141/77  Pulse: 68  Resp: 20  Temp: 98.2 F (36.8 C)  SpO2: 99%   Filed Weights   02/20/18 1400  Weight: 211 lb 8 oz (95.9 kg)  GENERAL:alert, no distress and comfortable SKIN: skin color, texture, turgor are normal, no rashes or significant lesions EYES: normal, Conjunctiva are pink and non-injected, sclera clear OROPHARYNX:no exudate, no erythema and lips, buccal mucosa, and tongue normal  NECK: supple, thyroid normal size, non-tender, without nodularity LYMPH:  no palpable lymphadenopathy in the cervical, axillary or inguinal LUNGS: clear to auscultation and  percussion with normal breathing effort HEART: regular rate & rhythm and no murmurs and no lower extremity edema ABDOMEN:abdomen soft, non-tender and normal bowel sounds MUSCULOSKELETAL:no cyanosis of digits and no clubbing  NEURO: alert & oriented x 3 with fluent speech, no focal motor/sensory deficits EXTREMITIES: No lower extremity edema BREAST: No palpable masses or nodules in either right or left breasts.  Right breast has been reconstructed.  No palpable axillary supraclavicular or infraclavicular adenopathy no breast tenderness or nipple discharge. (exam performed in the presence of a chaperone)  LABORATORY DATA:  I have reviewed the data as listed CMP Latest Ref Rng & Units 02/20/2018 05/06/2016 05/05/2016  Glucose 70 - 99 mg/dL 121(H) 111(H) 121(H)  BUN 8 - 23 mg/dL '19 10 11  '$ Creatinine 0.44 - 1.00 mg/dL 1.18(H) 0.94 0.97  Sodium 135 - 145 mmol/L 141 139 138  Potassium 3.5 - 5.1 mmol/L 3.6 3.7 3.2(L)  Chloride 98 - 111 mmol/L 105 109 108  CO2 22 - 32 mmol/L '27 24 23  '$ Calcium 8.9 - 10.3 mg/dL 9.6 8.1(L) 7.8(L)  Total Protein 6.5 - 8.1 g/dL 6.8 - 5.6(L)  Total Bilirubin 0.3 - 1.2 mg/dL 0.4 - 0.7  Alkaline Phos 38 - 126 U/L 63 - 47  AST 15 - 41 U/L 21 - 33  ALT 0 - 44 U/L 16 - 17    Lab Results  Component Value Date   WBC 6.8 02/20/2018   HGB 15.3 (H) 02/20/2018   HCT 47.8 (H) 02/20/2018   MCV 89.8 02/20/2018   PLT 515 (H) 02/20/2018   NEUTROABS 4.0 02/20/2018    ASSESSMENT & PLAN:  Essential thrombocytosis ([facility]) Takes hydroxyurea 3 times a week. Platelet counts remain stable. As long as the platelet count is below 700, we do not have to be overly concerned.  I believe it does fluctuate up and down over time. Continue with anticoagulation for history of recurrent blood clots.  Breast cancer of lower-inner quadrant of right female breast Stage I right breast cancer diagnosed in 1984: Status post mastectomy, adjuvant chemotherapy and adjuvant antiestrogen therapy.  Currently on surveillance  Breast Cancer Surveillance: 1. Breast exam 02/20/2018 2. Mammogram 05/24/2017: No abnormalities. Postsurgical changes. Breast Density Category B.  Return to clinic in 1 year for follow-up    Orders Placed This Encounter  Procedures  . CBC with Differential (Cancer Center Only)    Standing Status:   Future    Standing Expiration Date:   02/21/2019   The patient has a good understanding of the overall plan. she agrees with it. she will call with any problems that may develop before the next visit here.  Nicholas Lose, MD 02/20/2018   I, Cloyde Reams Dorshimer, am acting as scribe for Nicholas Lose, MD.  I have reviewed the above documentation for accuracy and completeness, and I agree with the above.

## 2018-02-20 ENCOUNTER — Inpatient Hospital Stay (HOSPITAL_BASED_OUTPATIENT_CLINIC_OR_DEPARTMENT_OTHER): Payer: Medicare Other | Admitting: Hematology and Oncology

## 2018-02-20 ENCOUNTER — Inpatient Hospital Stay: Payer: Medicare Other | Attending: Hematology and Oncology

## 2018-02-20 ENCOUNTER — Telehealth: Payer: Self-pay | Admitting: Hematology and Oncology

## 2018-02-20 ENCOUNTER — Ambulatory Visit: Payer: Medicare Other | Admitting: Hematology and Oncology

## 2018-02-20 ENCOUNTER — Other Ambulatory Visit: Payer: Medicare Other

## 2018-02-20 VITALS — BP 141/77 | HR 68 | Temp 98.2°F | Resp 20 | Ht 63.5 in | Wt 211.5 lb

## 2018-02-20 DIAGNOSIS — Z7901 Long term (current) use of anticoagulants: Secondary | ICD-10-CM

## 2018-02-20 DIAGNOSIS — Z9221 Personal history of antineoplastic chemotherapy: Secondary | ICD-10-CM | POA: Insufficient documentation

## 2018-02-20 DIAGNOSIS — D473 Essential (hemorrhagic) thrombocythemia: Secondary | ICD-10-CM

## 2018-02-20 DIAGNOSIS — Z79899 Other long term (current) drug therapy: Secondary | ICD-10-CM | POA: Insufficient documentation

## 2018-02-20 DIAGNOSIS — Z853 Personal history of malignant neoplasm of breast: Secondary | ICD-10-CM

## 2018-02-20 DIAGNOSIS — Z170 Estrogen receptor positive status [ER+]: Secondary | ICD-10-CM

## 2018-02-20 DIAGNOSIS — Z9011 Acquired absence of right breast and nipple: Secondary | ICD-10-CM

## 2018-02-20 DIAGNOSIS — C50311 Malignant neoplasm of lower-inner quadrant of right female breast: Secondary | ICD-10-CM

## 2018-02-20 LAB — CBC WITH DIFFERENTIAL (CANCER CENTER ONLY)
ABS IMMATURE GRANULOCYTES: 0.02 10*3/uL (ref 0.00–0.07)
BASOS ABS: 0.1 10*3/uL (ref 0.0–0.1)
BASOS PCT: 1 %
Eosinophils Absolute: 0.1 10*3/uL (ref 0.0–0.5)
Eosinophils Relative: 2 %
HCT: 47.8 % — ABNORMAL HIGH (ref 36.0–46.0)
HEMOGLOBIN: 15.3 g/dL — AB (ref 12.0–15.0)
IMMATURE GRANULOCYTES: 0 %
LYMPHS PCT: 31 %
Lymphs Abs: 2.1 10*3/uL (ref 0.7–4.0)
MCH: 28.8 pg (ref 26.0–34.0)
MCHC: 32 g/dL (ref 30.0–36.0)
MCV: 89.8 fL (ref 80.0–100.0)
Monocytes Absolute: 0.5 10*3/uL (ref 0.1–1.0)
Monocytes Relative: 7 %
NEUTROS ABS: 4 10*3/uL (ref 1.7–7.7)
NEUTROS PCT: 59 %
NRBC: 0 % (ref 0.0–0.2)
PLATELETS: 515 10*3/uL — AB (ref 150–400)
RBC: 5.32 MIL/uL — AB (ref 3.87–5.11)
RDW: 15.9 % — ABNORMAL HIGH (ref 11.5–15.5)
WBC: 6.8 10*3/uL (ref 4.0–10.5)

## 2018-02-20 LAB — CMP (CANCER CENTER ONLY)
ALBUMIN: 3.6 g/dL (ref 3.5–5.0)
ALT: 16 U/L (ref 0–44)
AST: 21 U/L (ref 15–41)
Alkaline Phosphatase: 63 U/L (ref 38–126)
Anion gap: 9 (ref 5–15)
BUN: 19 mg/dL (ref 8–23)
CHLORIDE: 105 mmol/L (ref 98–111)
CO2: 27 mmol/L (ref 22–32)
CREATININE: 1.18 mg/dL — AB (ref 0.44–1.00)
Calcium: 9.6 mg/dL (ref 8.9–10.3)
GFR, EST AFRICAN AMERICAN: 50 mL/min — AB (ref >60)
GFR, Estimated: 44 mL/min — ABNORMAL LOW (ref >60)
GLUCOSE: 121 mg/dL — AB (ref 70–99)
Potassium: 3.6 mmol/L (ref 3.5–5.1)
SODIUM: 141 mmol/L (ref 135–145)
Total Bilirubin: 0.4 mg/dL (ref 0.3–1.2)
Total Protein: 6.8 g/dL (ref 6.5–8.1)

## 2018-02-20 MED ORDER — HYDROXYUREA 500 MG PO CAPS: 500.0000 mg | ORAL_CAPSULE | ORAL | 3 refills | Status: DC

## 2018-02-20 NOTE — Assessment & Plan Note (Signed)
Takes hydroxyurea 3 times a week. Platelet counts remain stable. As long as the platelet count is below 700, we do not have to be overly concerned.  I believe it does fluctuate up and down over time. Continue with anticoagulation for history of recurrent blood clots.

## 2018-02-20 NOTE — Assessment & Plan Note (Signed)
Stage I right breast cancer diagnosed in 1984: Status post mastectomy, adjuvant chemotherapy and adjuvant antiestrogen therapy. Currently on surveillance  Breast Cancer Surveillance: 1. Breast exam 02/20/2018 2. Mammogram 05/24/2017: No abnormalities. Postsurgical changes. Breast Density Category B.  Return to clinic in 1 year for follow-up

## 2018-02-20 NOTE — Telephone Encounter (Signed)
Gave avs and calendar ° °

## 2018-03-18 ENCOUNTER — Ambulatory Visit: Payer: Medicare Other | Admitting: Internal Medicine

## 2018-03-18 ENCOUNTER — Encounter: Payer: Self-pay | Admitting: Internal Medicine

## 2018-03-18 VITALS — BP 158/91 | HR 72 | Ht 64.0 in | Wt 211.0 lb

## 2018-03-18 DIAGNOSIS — E782 Mixed hyperlipidemia: Secondary | ICD-10-CM

## 2018-03-18 DIAGNOSIS — I4510 Unspecified right bundle-branch block: Secondary | ICD-10-CM | POA: Diagnosis not present

## 2018-03-18 DIAGNOSIS — I10 Essential (primary) hypertension: Secondary | ICD-10-CM

## 2018-03-18 NOTE — Progress Notes (Signed)
OFFICE NOTE  Chief Complaint:  Routine follow-up  Primary Care Physician: Deland Pretty, MD  HPI:  Lori Page is a pleasant 83 year old female with past medical history significant for breast cancer, ET, and prior DVT on Xarelto. Recently she's been describing some increasing shortness of breath with exertion. An EKG performed at her primary care provider's office demonstrates a right bundle branch block and inferior T-wave abnormalities concerning for ischemia. There is a history of heart disease in her family including her mother who died suddenly of a massive MI at age 82 and a brother with a stroke and died PVCs as well as heart disease. She denies any chest pain. She does take Lasix however only twice weekly at most if needed. She occasionally gets some swelling and mostly in her left leg which could be related to post-thrombotic syndrome.  Lori Page returns today for follow-up. She reports her shortness of breath is not necessarily getting worse. She denies any chest pain. She underwent a nuclear stress test which was negative for ischemia. EF is preserved. She also had an echocardiogram which showed normal systolic function with mild diastolic dysfunction and mild pulmonary hypertension.  02/23/2015  I saw Lori Page back in the office today for follow-up. Overall she is currently doing well. A few months ago she was hospitalized for what sounds like sepsis, including fever and dehydration as well as a syncopal episode. The etiology of this was never determined. She recovered fairly quickly and has had no further single episodes. She seems to be doing well on Xarelto without any bleeding complications. She had an echocardiogram during that admission which was essentially unchanged from her prior echo. She has known mild mitral regurgitation and an audible murmur. She denies any chest pain or worsening shortness of breath. Unfortunately her weight is about the same as it was  last year.  03/18/2018  Lori Page is seen today in follow-up.  She is actually considered a new patient today since it is greater than 3 years from her last appointment.  She is done well over the last couple years.  She is followed by Dr. Lindi Adie and saw just recently.  She remains on Xarelto without bleeding problems as well as Hydrea.  She had recent lab work including lipid profile which showed LDL 61 total cholesterol 138.  Weight has been stable and is higher than ideal.  Blood pressure is elevated today however did come Page to 120/92.  Her blood pressure recently was 140 over 70s in the office.  She has a stable right bundle branch block.  There is a history of pulmonary hypertension which was mild however she denies any worsening shortness of breath with exertion.  PMHx:  Past Medical History:  Diagnosis Date  . Breast cancer, right breast ([place])   . DVT (deep venous thrombosis) ([facility])    LLE  . DVT, lower extremity, recurrent ([place])   . Essential thrombocytosis ([place]) 06/08/2011  . Headache    "a few/month" (05/04/2016)  . Hypertension   . Hypothyroidism     Past Surgical History:  Procedure Laterality Date  . BREAST BIOPSY Right ~ 1985  . CATARACT EXTRACTION W/ INTRAOCULAR LENS  IMPLANT, BILATERAL Bilateral 2000s  . DILATION AND CURETTAGE OF UTERUS    . MASTECTOMY Right ~ 1985  . REDUCTION MAMMAPLASTY Left   . VAGINAL HYSTERECTOMY      FAMHx:  Family History  Problem Relation Age of Onset  . Heart disease Mother   .  Heart attack Mother   . Cancer Father   . Stroke Brother   . Heart disease Brother   . Diabetes Brother     SOCHx:   reports that she has never smoked. She has never used smokeless tobacco. She reports that she does not drink alcohol or use drugs.  ALLERGIES:  No Known Allergies  ROS: Pertinent items noted in HPI and remainder of comprehensive ROS otherwise negative.  HOME MEDS: Current Outpatient Medications  Medication Sig Dispense Refill  .  alendronate (FOSAMAX) 70 MG tablet Take 70 mg by mouth once a week. Take with a full glass of water on an empty stomach.    . hydroxyurea (HYDREA) 500 MG capsule Take 1 capsule (500 mg total) by mouth 3 (three) times a week. 36 capsule 3  . levothyroxine (SYNTHROID, LEVOTHROID) 25 MCG tablet Take 25 mcg by mouth daily before breakfast.    . metoprolol succinate (TOPROL-XL) 100 MG 24 hr tablet Take 100 mg by mouth daily. Take with or immediately following a meal.    . pravastatin (PRAVACHOL) 80 MG tablet Take 80 mg by mouth every evening.   1  . rivaroxaban (XARELTO) 20 MG TABS tablet Take 20 mg by mouth daily with supper.    . triamterene-hydrochlorothiazide (MAXZIDE-25) 37.5-25 MG tablet Take 1 tablet by mouth daily.     No current facility-administered medications for this visit.     LABS/IMAGING: No results found for this or any previous visit (from the past 48 hour(s)). No results found.  VITALS: BP (!) 158/91   Pulse 72   Ht 5\' 4"  (1.626 m)   Wt 211 lb (95.7 kg)   BMI 36.22 kg/m   EXAM: General appearance: alert, no distress and moderately obese Neck: no carotid bruit and no JVD Lungs: clear to auscultation bilaterally Heart: regular rate and rhythm, S1, S2 normal, no murmur, click, rub or gallop Abdomen: soft, non-tender; bowel sounds normal; no masses,  no organomegaly Extremities: extremities normal, atraumatic, no cyanosis or edema Pulses: 2+ and symmetric Skin: Skin color, texture, turgor normal. No rashes or lesions Neurologic: Grossly normal Psych: Pleasant  EKG: Normal sinus rhythm, RBBB, lateral T wave changes at 72 and personally reviewed  ASSESSMENT: 1. Hypertension-controlled 2. RBBB 3. History of PE on Xarelto 4. Dyslipidemia 5. Mild pulmonary hypertension  PLAN: 1.   Lori Page has no new or concerning symptoms.  Blood pressure is reasonably well controlled.  She has a stable right bundle branch block.  She remains on Xarelto long-term due to history  of PE and Hydrea per Dr. Lindi Adie.  Cholesterol is at goal with LDL less than 70.  No changes were made to her medications today.  Plan follow-up with me in 2 years or sooner as necessary.  Pixie Casino, MD, [facility] [place] Director of the Advanced Lipid Disorders &  Cardiovascular Risk Reduction Clinic Diplomate of the American Board of Clinical Lipidology Attending Cardiologist  Direct Dial: [phone]  Fax: [phone]  Website:  [email or web address] 03/18/2018, 9:27 AM

## 2018-03-18 NOTE — Patient Instructions (Signed)
Medication Instructions:  Continue current medications If you need a refill on your cardiac medications before your next appointment, please call your pharmacy.   Follow-Up: At [facility], you and your health needs are our priority.  As part of our continuing mission to provide you with exceptional heart care, we have created designated Provider Care Teams.  These Care Teams include your primary Cardiologist (physician) and Advanced Practice Providers (APPs -  Physician Assistants and Nurse Practitioners) who all work together to provide you with the care you need, when you need it. You will need a follow up appointment in 2 years.  Please call our office 2 months in advance to schedule this appointment.  You may see Pixie Casino, MD or one of the following Advanced Practice Providers on your designated Care Team: [place], [place] . Fabian Sharp, PA-C  Any Other Special Instructions Will Be Listed Below (If Applicable).

## 2018-04-17 ENCOUNTER — Other Ambulatory Visit: Payer: Self-pay | Admitting: Internal Medicine

## 2018-04-17 DIAGNOSIS — Z1231 Encounter for screening mammogram for malignant neoplasm of breast: Secondary | ICD-10-CM

## 2018-04-25 DIAGNOSIS — H52223 Regular astigmatism, bilateral: Secondary | ICD-10-CM | POA: Diagnosis not present

## 2018-05-27 ENCOUNTER — Ambulatory Visit: Payer: Medicare Other

## 2018-06-27 ENCOUNTER — Telehealth: Payer: Self-pay

## 2018-06-27 MED FILL — HYDROXYUREA 500 MG CAPSULE: 500 | 84 days supply | Qty: 36 | Fill #0

## 2018-06-27 NOTE — Progress Notes (Signed)
Patient reports that pharmacy is reporting a back order of medication, Hydrea.  Pharmacy unaware of when medication will be re-stocked.    Will forward to MD for review and recommendations.

## 2018-06-27 NOTE — Telephone Encounter (Signed)
Nurse spoke with staff at [facility].  Hydrea 500mg  is in stock.   Nurse verbally called in Rx.  Patient notified, voiced understanding to pick up Rx at [place].  No further needs.

## 2018-07-16 ENCOUNTER — Other Ambulatory Visit: Payer: Self-pay

## 2018-07-16 ENCOUNTER — Ambulatory Visit
Admission: RE | Admit: 2018-07-16 | Discharge: 2018-07-16 | Disposition: A | Discharge status: Home / Self Care | Payer: Medicare Other | Source: Ambulatory Visit | Attending: Internal Medicine | Admitting: Internal Medicine

## 2018-07-16 DIAGNOSIS — Z1231 Encounter for screening mammogram for malignant neoplasm of breast: Secondary | ICD-10-CM

## 2018-11-01 DIAGNOSIS — Z23 Encounter for immunization: Secondary | ICD-10-CM | POA: Diagnosis not present

## 2019-01-16 DIAGNOSIS — I10 Essential (primary) hypertension: Secondary | ICD-10-CM | POA: Diagnosis not present

## 2019-01-16 DIAGNOSIS — E039 Hypothyroidism, unspecified: Secondary | ICD-10-CM | POA: Diagnosis not present

## 2019-01-20 DIAGNOSIS — R600 Localized edema: Secondary | ICD-10-CM | POA: Diagnosis not present

## 2019-01-20 DIAGNOSIS — N393 Stress incontinence (female) (male): Secondary | ICD-10-CM | POA: Diagnosis not present

## 2019-01-20 DIAGNOSIS — Z0000 Encounter for general adult medical examination without abnormal findings: Secondary | ICD-10-CM | POA: Diagnosis not present

## 2019-01-20 DIAGNOSIS — E039 Hypothyroidism, unspecified: Secondary | ICD-10-CM | POA: Diagnosis not present

## 2019-02-20 NOTE — Progress Notes (Signed)
Patient Care Team: Deland Pretty, MD as PCP - General (Internal Medicine) Debara Pickett Nadean Corwin, MD as PCP - Cardiology (Cardiology)  DIAGNOSIS:    ICD-10-CM   1. Malignant neoplasm of lower-inner quadrant of right breast of female, estrogen receptor positive ([place])  C50.311    Z17.0   2. Essential thrombocytosis (HCC)  D47.3 CBC with Differential (Cancer Center Only)    SUMMARY OF ONCOLOGIC HISTORY: Oncology History  Breast cancer of lower-inner quadrant of right female breast ([place])  09/23/1982 Surgery   Right mastectomy foll by reconstruction; Stage 1   10/24/1982 - 03/26/1983 Chemotherapy   Chemotherapy X 6 months   04/23/1983 - 10/23/1988 Anti-estrogen oral therapy   Tamoxifen 20 mg daily   Essential thrombocytosis ([place])  04/26/2010 Procedure   Bone marrow biopsy: Hypercellular with features of myeloproliferative neoplasm differential diagnosis ET vs P. vera   04/26/2010 -  Chemotherapy   Hydrea 500 mg twice daily; later changed to alternate once daily and twice daily (due to leg swelling); changed to once daily 10/24/13; decrease to 3 times a week on 02/22/2016     CHIEF COMPLIANT: Follow-up of essential thrombocytosis  INTERVAL HISTORY: Lori Page is a 84 y.o. with above-mentioned history of right breast cancer and essential thrombocytosis who is on currently taking Hydrea. I last saw the patient one year ago. Mammogram on 07/16/18 showed no evidence of malignancy in the left breast. She presents to the clinic today for annual follow-up.   ALLERGIES:  has No Known Allergies.  MEDICATIONS:  Current Outpatient Medications  Medication Sig Dispense Refill  . alendronate (FOSAMAX) 70 MG tablet Take 70 mg by mouth once a week. Take with a full glass of water on an empty stomach.    . hydroxyurea (HYDREA) 500 MG capsule Take 1 capsule (500 mg total) by mouth 3 (three) times a week. 36 capsule 3  . levothyroxine (SYNTHROID, LEVOTHROID) 25 MCG tablet Take 25 mcg by mouth daily  before breakfast.    . metoprolol succinate (TOPROL-XL) 100 MG 24 hr tablet Take 100 mg by mouth daily. Take with or immediately following a meal.    . pravastatin (PRAVACHOL) 80 MG tablet Take 80 mg by mouth every evening.   1  . rivaroxaban (XARELTO) 20 MG TABS tablet Take 20 mg by mouth daily with supper.    . triamterene-hydrochlorothiazide (MAXZIDE-25) 37.5-25 MG tablet Take 1 tablet by mouth daily.     No current facility-administered medications for this visit.    PHYSICAL EXAMINATION: ECOG PERFORMANCE STATUS: 1 - Symptomatic but completely ambulatory  Vitals:   02/21/19 1025  BP: (!) 156/72  Pulse: 84  Resp: 18  Temp: 98.5 F (36.9 C)  SpO2: 97%   Filed Weights   02/21/19 1025  Weight: 224 lb 3.2 oz (101.7 kg)    BREAST: No palpable masses or nodules in either right or left breasts. No palpable axillary supraclavicular or infraclavicular adenopathy no breast tenderness or nipple discharge. (exam performed in the presence of a chaperone)  LABORATORY DATA:  I have reviewed the data as listed CMP Latest Ref Rng & Units 02/20/2018 05/06/2016 05/05/2016  Glucose 70 - 99 mg/dL 121(H) 111(H) 121(H)  BUN 8 - 23 mg/dL _0 Creatinine 0.44 - 1.00 mg/dL 1.18(H) 0.94 0.97  Sodium 135 - 145 mmol/L 141 139 138  Potassium 3.5 - 5.1 mmol/L 3.6 3.7 3.2(L)  Chloride 98 - 111 mmol/L 105 109 108  CO2 22 - 32 mmol/L _1 Calcium  8.9 - 10.3 mg/dL 9.6 8.1(L) 7.8(L)  Total Protein 6.5 - 8.1 g/dL 6.8 - 5.6(L)  Total Bilirubin 0.3 - 1.2 mg/dL 0.4 - 0.7  Alkaline Phos 38 - 126 U/L 63 - 47  AST 15 - 41 U/L 21 - 33  ALT 0 - 44 U/L 16 - 17    Lab Results  Component Value Date   WBC 7.2 02/21/2019   HGB 15.0 02/21/2019   HCT 46.5 (H) 02/21/2019   MCV 89.4 02/21/2019   PLT 577 (H) 02/21/2019   NEUTROABS 3.9 02/21/2019    ASSESSMENT & PLAN:  Essential thrombocytosis (HCC) Takes hydroxyurea 3 times a week. Lab review:. Goal of treatment: To keep platelet count less than 700    Hydroxyurea toxicities: Being monitored  continue with anticoagulation for history of recurrent blood clots.  Breast cancer of lower-inner quadrant of right female breast Stage I right breast cancer diagnosed in 1984: Status post mastectomy, adjuvant chemotherapy and adjuvant antiestrogen therapy. Currently on surveillance  Breast Cancer Surveillance: 1. Breast exam1/09/2019: Benign no palpable lumps or nodules 2. Mammogram6/03/2018: Benign. Postsurgical changes. Breast Density Category B.  Return to clinic in 1 year for follow-up    Orders Placed This Encounter  Procedures  . CBC with Differential (Cancer Center Only)    Standing Status:   Future    Standing Expiration Date:   02/21/2020   The patient has a good understanding of the overall plan. she agrees with it. she will call with any problems that may develop before the next visit here.  Total time spent: 15 mins including face to face time and time spent for planning, charting and coordination of care  Gudena, Vinay, MD 02/21/2019  I, Molly Dorshimer, am acting as scribe for Dr. Vinay Gudena.  I have reviewed the above documentation for accuracy and completeness, and I agree with the above.       

## 2019-02-21 ENCOUNTER — Other Ambulatory Visit: Payer: Self-pay

## 2019-02-21 ENCOUNTER — Telehealth: Payer: Self-pay | Admitting: Hematology and Oncology

## 2019-02-21 ENCOUNTER — Inpatient Hospital Stay: Payer: Medicare HMO | Attending: Hematology and Oncology

## 2019-02-21 ENCOUNTER — Inpatient Hospital Stay (HOSPITAL_BASED_OUTPATIENT_CLINIC_OR_DEPARTMENT_OTHER): Payer: Medicare HMO | Admitting: Hematology and Oncology

## 2019-02-21 VITALS — BP 156/72 | HR 84 | Temp 98.5°F | Resp 18 | Ht 64.0 in | Wt 224.2 lb

## 2019-02-21 DIAGNOSIS — D473 Essential (hemorrhagic) thrombocythemia: Secondary | ICD-10-CM | POA: Diagnosis not present

## 2019-02-21 DIAGNOSIS — Z853 Personal history of malignant neoplasm of breast: Secondary | ICD-10-CM | POA: Diagnosis present

## 2019-02-21 DIAGNOSIS — Z79899 Other long term (current) drug therapy: Secondary | ICD-10-CM | POA: Insufficient documentation

## 2019-02-21 DIAGNOSIS — Z9221 Personal history of antineoplastic chemotherapy: Secondary | ICD-10-CM | POA: Diagnosis not present

## 2019-02-21 DIAGNOSIS — C50311 Malignant neoplasm of lower-inner quadrant of right female breast: Secondary | ICD-10-CM

## 2019-02-21 DIAGNOSIS — Z9011 Acquired absence of right breast and nipple: Secondary | ICD-10-CM | POA: Insufficient documentation

## 2019-02-21 DIAGNOSIS — Z7901 Long term (current) use of anticoagulants: Secondary | ICD-10-CM | POA: Diagnosis not present

## 2019-02-21 DIAGNOSIS — Z170 Estrogen receptor positive status [ER+]: Secondary | ICD-10-CM

## 2019-02-21 LAB — CBC WITH DIFFERENTIAL (CANCER CENTER ONLY)
Abs Immature Granulocytes: 0.02 10*3/uL (ref 0.00–0.07)
Basophils Absolute: 0.1 10*3/uL (ref 0.0–0.1)
Basophils Relative: 1 %
Eosinophils Absolute: 0.2 10*3/uL (ref 0.0–0.5)
Eosinophils Relative: 2 %
HCT: 46.5 % — ABNORMAL HIGH (ref 36.0–46.0)
Hemoglobin: 15 g/dL (ref 12.0–15.0)
Immature Granulocytes: 0 %
Lymphocytes Relative: 35 %
Lymphs Abs: 2.5 10*3/uL (ref 0.7–4.0)
MCH: 28.8 pg (ref 26.0–34.0)
MCHC: 32.3 g/dL (ref 30.0–36.0)
MCV: 89.4 fL (ref 80.0–100.0)
Monocytes Absolute: 0.6 10*3/uL (ref 0.1–1.0)
Monocytes Relative: 8 %
Neutro Abs: 3.9 10*3/uL (ref 1.7–7.7)
Neutrophils Relative %: 54 %
Platelet Count: 577 10*3/uL — ABNORMAL HIGH (ref 150–400)
RBC: 5.2 MIL/uL — ABNORMAL HIGH (ref 3.87–5.11)
RDW: 16.7 % — ABNORMAL HIGH (ref 11.5–15.5)
WBC Count: 7.2 10*3/uL (ref 4.0–10.5)
nRBC: 0 % (ref 0.0–0.2)

## 2019-02-21 NOTE — Telephone Encounter (Signed)
I talk with patient regarding schedule  

## 2019-02-21 NOTE — Assessment & Plan Note (Signed)
Stage I right breast cancer diagnosed in 1984: Status post mastectomy, adjuvant chemotherapy and adjuvant antiestrogen therapy. Currently on surveillance  Breast Cancer Surveillance: 1. Breast exam1/09/2019: Benign no palpable lumps or nodules 2. Mammogram6/03/2018: Benign. Postsurgical changes. Breast Density Category B.  Return to clinic in 1 year for follow-up

## 2019-02-21 NOTE — Assessment & Plan Note (Signed)
Takes hydroxyurea 3 times a week. Lab review:Marland Kitchen Goal of treatment: To keep platelet count less than 700  Hydroxyurea toxicities: Being monitored  continue with anticoagulation for history of recurrent blood clots.

## 2019-03-03 ENCOUNTER — Other Ambulatory Visit: Payer: Self-pay | Admitting: Hematology and Oncology

## 2019-03-04 ENCOUNTER — Other Ambulatory Visit: Payer: Self-pay

## 2019-03-04 MED ORDER — HYDROXYUREA 500 MG PO CAPS: ORAL_CAPSULE | ORAL | 0 refills | Status: DC

## 2019-03-09 ENCOUNTER — Ambulatory Visit: Payer: Medicare HMO

## 2019-05-01 DIAGNOSIS — H35413 Lattice degeneration of retina, bilateral: Secondary | ICD-10-CM | POA: Diagnosis not present

## 2019-05-01 DIAGNOSIS — Z961 Presence of intraocular lens: Secondary | ICD-10-CM | POA: Diagnosis not present

## 2019-05-01 DIAGNOSIS — H52203 Unspecified astigmatism, bilateral: Secondary | ICD-10-CM | POA: Diagnosis not present

## 2019-05-12 DIAGNOSIS — M810 Age-related osteoporosis without current pathological fracture: Secondary | ICD-10-CM | POA: Diagnosis not present

## 2019-06-16 ENCOUNTER — Other Ambulatory Visit: Payer: Self-pay | Admitting: Internal Medicine

## 2019-06-16 DIAGNOSIS — Z1231 Encounter for screening mammogram for malignant neoplasm of breast: Secondary | ICD-10-CM

## 2019-06-23 DIAGNOSIS — I10 Essential (primary) hypertension: Secondary | ICD-10-CM | POA: Diagnosis not present

## 2019-06-23 DIAGNOSIS — M810 Age-related osteoporosis without current pathological fracture: Secondary | ICD-10-CM | POA: Diagnosis not present

## 2019-07-17 ENCOUNTER — Ambulatory Visit: Payer: Medicare HMO

## 2019-08-06 ENCOUNTER — Ambulatory Visit
Admission: RE | Admit: 2019-08-06 | Discharge: 2019-08-06 | Disposition: A | Discharge status: Home / Self Care | Payer: Medicare HMO | Source: Ambulatory Visit | Attending: Internal Medicine | Admitting: Internal Medicine

## 2019-08-06 ENCOUNTER — Other Ambulatory Visit: Payer: Self-pay

## 2019-08-06 DIAGNOSIS — Z1231 Encounter for screening mammogram for malignant neoplasm of breast: Secondary | ICD-10-CM | POA: Diagnosis not present

## 2019-08-23 ENCOUNTER — Other Ambulatory Visit: Payer: Self-pay | Admitting: Hematology and Oncology

## 2019-08-23 DIAGNOSIS — C50919 Malignant neoplasm of unspecified site of unspecified female breast: Secondary | ICD-10-CM | POA: Diagnosis not present

## 2019-08-23 DIAGNOSIS — M810 Age-related osteoporosis without current pathological fracture: Secondary | ICD-10-CM | POA: Diagnosis not present

## 2019-08-23 DIAGNOSIS — R32 Unspecified urinary incontinence: Secondary | ICD-10-CM | POA: Diagnosis not present

## 2019-08-23 DIAGNOSIS — E785 Hyperlipidemia, unspecified: Secondary | ICD-10-CM | POA: Diagnosis not present

## 2019-08-23 DIAGNOSIS — I10 Essential (primary) hypertension: Secondary | ICD-10-CM | POA: Diagnosis not present

## 2019-08-23 DIAGNOSIS — E039 Hypothyroidism, unspecified: Secondary | ICD-10-CM | POA: Diagnosis not present

## 2019-08-23 DIAGNOSIS — Z823 Family history of stroke: Secondary | ICD-10-CM | POA: Diagnosis not present

## 2019-08-23 DIAGNOSIS — E669 Obesity, unspecified: Secondary | ICD-10-CM | POA: Diagnosis not present

## 2019-08-23 DIAGNOSIS — Z008 Encounter for other general examination: Secondary | ICD-10-CM | POA: Diagnosis not present

## 2019-08-23 DIAGNOSIS — Z7901 Long term (current) use of anticoagulants: Secondary | ICD-10-CM | POA: Diagnosis not present

## 2019-08-23 DIAGNOSIS — Z79899 Other long term (current) drug therapy: Secondary | ICD-10-CM | POA: Diagnosis not present

## 2019-09-26 ENCOUNTER — Encounter: Payer: Self-pay | Admitting: Genetic Counselor

## 2019-11-07 DIAGNOSIS — Z23 Encounter for immunization: Secondary | ICD-10-CM | POA: Diagnosis not present

## 2019-11-19 ENCOUNTER — Other Ambulatory Visit: Payer: Self-pay | Admitting: Hematology and Oncology

## 2019-11-29 ENCOUNTER — Ambulatory Visit: Payer: Medicare HMO | Attending: Internal Medicine

## 2019-11-29 ENCOUNTER — Other Ambulatory Visit: Payer: Self-pay

## 2019-11-29 DIAGNOSIS — Z23 Encounter for immunization: Secondary | ICD-10-CM

## 2019-11-29 NOTE — Progress Notes (Signed)
   Covid-19 Vaccination Clinic  Name:  Lori Page    MRN: [id number] DOB: Apr 11, 1935  11/29/2019  [name] was observed post Covid-19 immunization for 15 minutes without incident. She was provided with Vaccine Information Sheet and instruction to access the V-Safe system.   [name] was instructed to call 911 with any severe reactions post vaccine: Marland Kitchen Difficulty breathing  . Swelling of face and throat  . A fast heartbeat  . A bad rash all over body  . Dizziness and weakness

## 2020-01-21 DIAGNOSIS — N390 Urinary tract infection, site not specified: Secondary | ICD-10-CM | POA: Diagnosis not present

## 2020-01-21 DIAGNOSIS — E039 Hypothyroidism, unspecified: Secondary | ICD-10-CM | POA: Diagnosis not present

## 2020-01-21 DIAGNOSIS — I10 Essential (primary) hypertension: Secondary | ICD-10-CM | POA: Diagnosis not present

## 2020-01-21 DIAGNOSIS — E7800 Pure hypercholesterolemia, unspecified: Secondary | ICD-10-CM | POA: Diagnosis not present

## 2020-01-26 DIAGNOSIS — N1831 Chronic kidney disease, stage 3a: Secondary | ICD-10-CM | POA: Diagnosis not present

## 2020-01-26 DIAGNOSIS — I5032 Chronic diastolic (congestive) heart failure: Secondary | ICD-10-CM | POA: Diagnosis not present

## 2020-01-26 DIAGNOSIS — J309 Allergic rhinitis, unspecified: Secondary | ICD-10-CM | POA: Diagnosis not present

## 2020-01-26 DIAGNOSIS — D473 Essential (hemorrhagic) thrombocythemia: Secondary | ICD-10-CM | POA: Diagnosis not present

## 2020-01-26 DIAGNOSIS — Z0000 Encounter for general adult medical examination without abnormal findings: Secondary | ICD-10-CM | POA: Diagnosis not present

## 2020-01-26 DIAGNOSIS — N393 Stress incontinence (female) (male): Secondary | ICD-10-CM | POA: Diagnosis not present

## 2020-01-26 DIAGNOSIS — E039 Hypothyroidism, unspecified: Secondary | ICD-10-CM | POA: Diagnosis not present

## 2020-01-26 DIAGNOSIS — R600 Localized edema: Secondary | ICD-10-CM | POA: Diagnosis not present

## 2020-01-26 DIAGNOSIS — M8589 Other specified disorders of bone density and structure, multiple sites: Secondary | ICD-10-CM | POA: Diagnosis not present

## 2020-01-26 DIAGNOSIS — M25511 Pain in right shoulder: Secondary | ICD-10-CM | POA: Diagnosis not present

## 2020-01-26 DIAGNOSIS — I10 Essential (primary) hypertension: Secondary | ICD-10-CM | POA: Diagnosis not present

## 2020-02-22 NOTE — Progress Notes (Signed)
Patient Care Team: Deland Pretty, MD as PCP - General (Internal Medicine) Debara Pickett Nadean Corwin, MD as PCP - Cardiology (Cardiology)  DIAGNOSIS:    ICD-10-CM   1. Personal history of venous thrombosis and embolism  Z86.718   2. Essential thrombocytosis (HCC)  D47.3   3. Malignant neoplasm of lower-inner quadrant of right breast of female, estrogen receptor positive ([place])  C50.311    Z17.0     SUMMARY OF ONCOLOGIC HISTORY: Oncology History  Breast cancer of lower-inner quadrant of right female breast ([place])  09/23/1982 Surgery   Right mastectomy foll by reconstruction; Stage 1   10/24/1982 - 03/26/1983 Chemotherapy   Chemotherapy X 6 months   04/23/1983 - 10/23/1988 Anti-estrogen oral therapy   Tamoxifen 20 mg daily   Essential thrombocytosis ([place])  04/26/2010 Procedure   Bone marrow biopsy: Hypercellular with features of myeloproliferative neoplasm differential diagnosis ET vs P. vera   04/26/2010 -  Chemotherapy   Hydrea 500 mg twice daily; later changed to alternate once daily and twice daily (due to leg swelling); changed to once daily 10/24/13; decrease to 3 times a week on 02/22/2016     CHIEF COMPLIANT: Follow-up of essential thrombocytosis  INTERVAL HISTORY: Lori Page is a 85 y.o. with above-mentioned history of right breast cancer andessential thrombocytosis who is on currently taking Hydrea.Mammogram on 08/06/19 showed no evidence of malignancy in the left breast.She presents to the clinictoday for annual follow-up.   She denies any adverse effects of Hydrea.  Denies any lumps or nodules in the breast.  ALLERGIES:  has No Known Allergies.  MEDICATIONS:  Current Outpatient Medications  Medication Sig Dispense Refill   alendronate (FOSAMAX) 70 MG tablet Take 70 mg by mouth once a week. Take with a full glass of water on an empty stomach.     hydroxyurea (HYDREA) 500 MG capsule TAKE 1 CAPSULE BY MOUTH THREE TIMES A WEEK 36 capsule 0   levothyroxine (SYNTHROID,  LEVOTHROID) 25 MCG tablet Take 25 mcg by mouth daily before breakfast.     metoprolol succinate (TOPROL-XL) 100 MG 24 hr tablet Take 100 mg by mouth daily. Take with or immediately following a meal.     pravastatin (PRAVACHOL) 80 MG tablet Take 80 mg by mouth every evening.   1   rivaroxaban (XARELTO) 20 MG TABS tablet Take 20 mg by mouth daily with supper.     triamterene-hydrochlorothiazide (MAXZIDE-25) 37.5-25 MG tablet Take 1 tablet by mouth daily.     No current facility-administered medications for this visit.    PHYSICAL EXAMINATION: ECOG PERFORMANCE STATUS: 1 - Symptomatic but completely ambulatory  Vitals:   02/23/20 1052  BP: (!) 143/96  Pulse: 86  Resp: 17  Temp: (!) 97.5 F (36.4 C)  SpO2: 98%   Filed Weights   02/23/20 1052  Weight: 224 lb (101.6 kg)      LABORATORY DATA:  I have reviewed the data as listed CMP Latest Ref Rng & Units 02/20/2018 05/06/2016 05/05/2016  Glucose 70 - 99 mg/dL 121(H) 111(H) 121(H)  BUN 8 - 23 mg/dL $Remove'19 10 11  'jiDnqhr$ Creatinine 0.44 - 1.00 mg/dL 1.18(H) 0.94 0.97  Sodium 135 - 145 mmol/L 141 139 138  Potassium 3.5 - 5.1 mmol/L 3.6 3.7 3.2(L)  Chloride 98 - 111 mmol/L 105 109 108  CO2 22 - 32 mmol/L $RemoveB'27 24 23  'ZBHuradg$ Calcium 8.9 - 10.3 mg/dL 9.6 8.1(L) 7.8(L)  Total Protein 6.5 - 8.1 g/dL 6.8 - 5.6(L)  Total Bilirubin 0.3 - 1.2 mg/dL 0.4 -  0.7  Alkaline Phos 38 - 126 U/L 63 - 47  AST 15 - 41 U/L 21 - 33  ALT 0 - 44 U/L 16 - 17    Lab Results  Component Value Date   WBC 7.9 02/23/2020   HGB 14.6 02/23/2020   HCT 45.2 02/23/2020   MCV 87.6 02/23/2020   PLT 620 (H) 02/23/2020   NEUTROABS 4.7 02/23/2020    ASSESSMENT & PLAN:  Essential thrombocytosis ([place])  Lab review: Platelet count 620 (was 577), WBC 7.9, hemoglobin 14.6 Plan: We will increase the dosage of Hydrea to 4 times a week.  Goal of treatment: To keep platelet count less than 600 Hydroxyurea toxicities: Being monitored, no adverse effects  continue with anticoagulation for  history of recurrent blood clots.  Breast cancer of lower-inner quadrant of right female breast Stage I right breast cancer diagnosed in 1984: Status post mastectomy, adjuvant chemotherapy and adjuvant antiestrogen therapy. Currently on surveillance  Breast Cancer Surveillance: 1. Breast exam1/09/2019: Benign no palpable lumps or nodules 2. Mammogram left breast6/24/2021: Benign. Breast Density Category B.  Return to clinic in 1 year for follow-up    No orders of the defined types were placed in this encounter.  The patient has a good understanding of the overall plan. she agrees with it. she will call with any problems that may develop before the next visit here.  Total time spent: 20 mins including face to face time and time spent for planning, charting and coordination of care  Nicholas Lose, MD 02/23/2020  I, Cloyde Reams Dorshimer, am acting as scribe for Dr. Nicholas Lose.  I have reviewed the above documentation for accuracy and completeness, and I agree with the above.

## 2020-02-23 ENCOUNTER — Other Ambulatory Visit: Payer: Self-pay

## 2020-02-23 ENCOUNTER — Other Ambulatory Visit: Payer: Self-pay | Admitting: *Deleted

## 2020-02-23 ENCOUNTER — Inpatient Hospital Stay: Payer: Medicare HMO

## 2020-02-23 ENCOUNTER — Inpatient Hospital Stay: Payer: Medicare HMO | Attending: Hematology and Oncology | Admitting: Hematology and Oncology

## 2020-02-23 VITALS — BP 143/96 | HR 86 | Temp 97.5°F | Resp 17 | Ht 64.0 in | Wt 224.0 lb

## 2020-02-23 DIAGNOSIS — Z86718 Personal history of other venous thrombosis and embolism: Secondary | ICD-10-CM | POA: Diagnosis not present

## 2020-02-23 DIAGNOSIS — Z7901 Long term (current) use of anticoagulants: Secondary | ICD-10-CM | POA: Diagnosis not present

## 2020-02-23 DIAGNOSIS — Z79899 Other long term (current) drug therapy: Secondary | ICD-10-CM | POA: Insufficient documentation

## 2020-02-23 DIAGNOSIS — Z9011 Acquired absence of right breast and nipple: Secondary | ICD-10-CM | POA: Diagnosis not present

## 2020-02-23 DIAGNOSIS — Z9221 Personal history of antineoplastic chemotherapy: Secondary | ICD-10-CM | POA: Diagnosis not present

## 2020-02-23 DIAGNOSIS — C50311 Malignant neoplasm of lower-inner quadrant of right female breast: Secondary | ICD-10-CM | POA: Diagnosis not present

## 2020-02-23 DIAGNOSIS — Z170 Estrogen receptor positive status [ER+]: Secondary | ICD-10-CM

## 2020-02-23 DIAGNOSIS — D473 Essential (hemorrhagic) thrombocythemia: Secondary | ICD-10-CM | POA: Insufficient documentation

## 2020-02-23 DIAGNOSIS — Z853 Personal history of malignant neoplasm of breast: Secondary | ICD-10-CM | POA: Insufficient documentation

## 2020-02-23 LAB — CBC WITH DIFFERENTIAL (CANCER CENTER ONLY)
Abs Immature Granulocytes: 0.03 10*3/uL (ref 0.00–0.07)
Basophils Absolute: 0.1 10*3/uL (ref 0.0–0.1)
Basophils Relative: 1 %
Eosinophils Absolute: 0.2 10*3/uL (ref 0.0–0.5)
Eosinophils Relative: 2 %
HCT: 45.2 % (ref 36.0–46.0)
Hemoglobin: 14.6 g/dL (ref 12.0–15.0)
Immature Granulocytes: 0 %
Lymphocytes Relative: 30 %
Lymphs Abs: 2.4 10*3/uL (ref 0.7–4.0)
MCH: 28.3 pg (ref 26.0–34.0)
MCHC: 32.3 g/dL (ref 30.0–36.0)
MCV: 87.6 fL (ref 80.0–100.0)
Monocytes Absolute: 0.6 10*3/uL (ref 0.1–1.0)
Monocytes Relative: 8 %
Neutro Abs: 4.7 10*3/uL (ref 1.7–7.7)
Neutrophils Relative %: 59 %
Platelet Count: 620 10*3/uL — ABNORMAL HIGH (ref 150–400)
RBC: 5.16 MIL/uL — ABNORMAL HIGH (ref 3.87–5.11)
RDW: 17.3 % — ABNORMAL HIGH (ref 11.5–15.5)
WBC Count: 7.9 10*3/uL (ref 4.0–10.5)
nRBC: 0 % (ref 0.0–0.2)

## 2020-02-23 MED ORDER — HYDROXYUREA 500 MG PO CAPS: 500.0000 mg | ORAL_CAPSULE | ORAL | 3 refills | Status: DC

## 2020-02-23 NOTE — Assessment & Plan Note (Signed)
Stage I right breast cancer diagnosed in 1984: Status post mastectomy, adjuvant chemotherapy and adjuvant antiestrogen therapy. Currently on surveillance  Breast Cancer Surveillance: 1. Breast exam1/09/2019: Benign no palpable lumps or nodules 2. Mammogram left breast6/24/2021: Benign. Breast Density Category B.  Return to clinic in 1 year for follow-up

## 2020-02-23 NOTE — Assessment & Plan Note (Signed)
Takes hydroxyurea 3 times a week. Lab review:. Goal of treatment: To keep platelet count less than 700  Hydroxyurea toxicities: Being monitored  continue with anticoagulation for history of recurrent blood clots. 

## 2020-02-24 DIAGNOSIS — E782 Mixed hyperlipidemia: Secondary | ICD-10-CM | POA: Diagnosis not present

## 2020-02-24 DIAGNOSIS — R7301 Impaired fasting glucose: Secondary | ICD-10-CM | POA: Diagnosis not present

## 2020-02-24 DIAGNOSIS — Z86711 Personal history of pulmonary embolism: Secondary | ICD-10-CM | POA: Diagnosis not present

## 2020-02-24 DIAGNOSIS — I10 Essential (primary) hypertension: Secondary | ICD-10-CM | POA: Diagnosis not present

## 2020-02-24 DIAGNOSIS — R7303 Prediabetes: Secondary | ICD-10-CM | POA: Diagnosis not present

## 2020-03-09 ENCOUNTER — Other Ambulatory Visit: Payer: Self-pay

## 2020-03-09 ENCOUNTER — Ambulatory Visit: Payer: Medicare HMO | Admitting: Internal Medicine

## 2020-03-09 ENCOUNTER — Encounter: Payer: Self-pay | Admitting: Internal Medicine

## 2020-03-09 VITALS — BP 124/98 | HR 78 | Ht 65.0 in | Wt 222.2 lb

## 2020-03-09 DIAGNOSIS — I493 Ventricular premature depolarization: Secondary | ICD-10-CM

## 2020-03-09 DIAGNOSIS — E782 Mixed hyperlipidemia: Secondary | ICD-10-CM

## 2020-03-09 DIAGNOSIS — I10 Essential (primary) hypertension: Secondary | ICD-10-CM | POA: Diagnosis not present

## 2020-03-09 DIAGNOSIS — I4510 Unspecified right bundle-branch block: Secondary | ICD-10-CM

## 2020-03-09 NOTE — Patient Instructions (Signed)
Medication Instructions:  Continue current medications  *If you need a refill on your cardiac medications before your next appointment, please call your pharmacy*   Lab Work: None ordered   Testing/Procedures: None Ordered   Follow-Up: At [facility], you and your health needs are our priority.  As part of our continuing mission to provide you with exceptional heart care, we have created designated Provider Care Teams.  These Care Teams include your primary Cardiologist (physician) and Advanced Practice Providers (APPs -  Physician Assistants and Nurse Practitioners) who all work together to provide you with the care you need, when you need it.  We recommend signing up for the patient portal called "MyChart".  Sign up information is provided on this After Visit Summary.  MyChart is used to connect with patients for Virtual Visits (Telemedicine).  Patients are able to view lab/test results, encounter notes, upcoming appointments, etc.  Non-urgent messages can be sent to your provider as well.   To learn more about what you can do with MyChart, go to [email or web address].    Your next appointment:   2 year(s)  The format for your next appointment:   In Person  Provider:   You may see Pixie Casino, MD or one of the following Advanced Practice Providers on your designated Care Team:    Almyra Deforest, PA-C  Fabian Sharp, PA-C or   Roby Lofts, [place]

## 2020-03-09 NOTE — Progress Notes (Signed)
OFFICE NOTE  Chief Complaint:  Routine follow-up  Primary Care Physician: Deland Pretty, MD  HPI:  Lori Page is a pleasant 85 year old female with past medical history significant for breast cancer, ET, and prior DVT on Xarelto. Recently she's been describing some increasing shortness of breath with exertion. An EKG performed at her primary care provider's office demonstrates a right bundle branch block and inferior T-wave abnormalities concerning for ischemia. There is a history of heart disease in her family including her mother who died suddenly of a massive MI at age 77 and a brother with a stroke and died PVCs as well as heart disease. She denies any chest pain. She does take Lasix however only twice weekly at most if needed. She occasionally gets some swelling and mostly in her left leg which could be related to post-thrombotic syndrome.  Lori Page returns today for follow-up. She reports her shortness of breath is not necessarily getting worse. She denies any chest pain. She underwent a nuclear stress test which was negative for ischemia. EF is preserved. She also had an echocardiogram which showed normal systolic function with mild diastolic dysfunction and mild pulmonary hypertension.  02/23/2015  I saw Lori Page back in the office today for follow-up. Overall she is currently doing well. A few months ago she was hospitalized for what sounds like sepsis, including fever and dehydration as well as a syncopal episode. The etiology of this was never determined. She recovered fairly quickly and has had no further single episodes. She seems to be doing well on Xarelto without any bleeding complications. She had an echocardiogram during that admission which was essentially unchanged from her prior echo. She has known mild mitral regurgitation and an audible murmur. She denies any chest pain or worsening shortness of breath. Unfortunately her weight is about the same as it was  last year.  03/18/2018  Lori Page is seen today in follow-up.  She is actually considered a new patient today since it is greater than 3 years from her last appointment.  She is done well over the last couple years.  She is followed by Dr. Lindi Adie and saw just recently.  She remains on Xarelto without bleeding problems as well as Hydrea.  She had recent lab work including lipid profile which showed LDL 61 total cholesterol 138.  Weight has been stable and is higher than ideal.  Blood pressure is elevated today however did come down to 120/92.  Her blood pressure recently was 140 over 70s in the office.  She has a stable right bundle branch block.  There is a history of pulmonary hypertension which was mild however she denies any worsening shortness of breath with exertion.  03/09/2020  Lori Page returns today for 2-year follow-up.  She denies any chest pain or worsening shortness of breath.  She continues to see Dr. Lindi Adie and recently had an increase in her Hydrea.  She denies any bleeding on Xarelto.  She has had no further clotting issues.  Blood pressure appears to be well controlled at home although diastolic was a little high today.  She denies any worsening shortness of breath.  She has a stable right bundle branch block.  PMHx:  Past Medical History:  Diagnosis Date  . Breast cancer, right breast ([place])   . DVT (deep venous thrombosis) (HCC)    LLE  . DVT, lower extremity, recurrent ([place])   . Essential thrombocytosis ([place]) 06/08/2011  . Headache    "a few/month" (05/04/2016)  .  Hypertension   . Hypothyroidism     Past Surgical History:  Procedure Laterality Date  . BREAST BIOPSY Right ~ 1985  . CATARACT EXTRACTION W/ INTRAOCULAR LENS  IMPLANT, BILATERAL Bilateral 2000s  . DILATION AND CURETTAGE OF UTERUS    . MASTECTOMY Right ~ 1985  . REDUCTION MAMMAPLASTY Left   . VAGINAL HYSTERECTOMY      FAMHx:  Family History  Problem Relation Age of Onset  . Heart disease Mother    . Heart attack Mother   . Cancer Father   . Stroke Brother   . Heart disease Brother   . Diabetes Brother     SOCHx:   reports that she has never smoked. She has never used smokeless tobacco. She reports that she does not drink alcohol and does not use drugs.  ALLERGIES:  No Known Allergies  ROS: Pertinent items noted in HPI and remainder of comprehensive ROS otherwise negative.  HOME MEDS: Current Outpatient Medications  Medication Sig Dispense Refill  . alendronate (FOSAMAX) 70 MG tablet Take 70 mg by mouth once a week. Take with a full glass of water on an empty stomach.    . hydroxyurea (HYDREA) 500 MG capsule Take 1 capsule (500 mg total) by mouth 4 (four) times a week. May take with food to minimize GI side effects. 90 capsule 3  . levothyroxine (SYNTHROID, LEVOTHROID) 25 MCG tablet Take 25 mcg by mouth daily before breakfast.    . metoprolol succinate (TOPROL-XL) 100 MG 24 hr tablet Take 100 mg by mouth daily. Take with or immediately following a meal.    . pravastatin (PRAVACHOL) 80 MG tablet Take 80 mg by mouth every evening.   1  . rivaroxaban (XARELTO) 20 MG TABS tablet Take 20 mg by mouth daily with supper.    . triamterene-hydrochlorothiazide (MAXZIDE-25) 37.5-25 MG tablet Take 1 tablet by mouth daily.     No current facility-administered medications for this visit.    LABS/IMAGING: No results found for this or any previous visit (from the past 48 hour(s)). No results found.  VITALS: BP (!) 124/98   Pulse 78   Ht 5\' 5"  (1.651 m)   Wt 222 lb 3.2 oz (100.8 kg)   SpO2 98%   BMI 36.98 kg/m   EXAM: General appearance: alert, no distress and moderately obese Neck: no carotid bruit and no JVD Lungs: clear to auscultation bilaterally Heart: regular rate and rhythm, S1, S2 normal, no murmur, click, rub or gallop Abdomen: soft, non-tender; bowel sounds normal; no masses,  no organomegaly Extremities: extremities normal, atraumatic, no cyanosis or edema Pulses:  2+ and symmetric Skin: Skin color, texture, turgor normal. No rashes or lesions Neurologic: Grossly normal Psych: Pleasant  EKG: Normal sinus rhythm with PVCs at 78, right bundle branch block-personally reviewed  ASSESSMENT: 1. Hypertension-controlled 2. RBBB 3. History of PE on Xarelto 4. Dyslipidemia 5. Mild pulmonary hypertension  6. PVC's - asymptomatic  PLAN: 1.   Ms Page continues to do well.  Blood pressures well controlled at home.  Her right bundle branch block is stable.  She has had no further DVT or PE on Xarelto.  Cholesterol was well controlled with LDL of 58 in December when she saw her PCP.  Although she had some mild pulmonary hypertension, she denies any shortness of breath.  Weight continues to be an issue which she is working on.  She did have some PVCs today but she is asymptomatic with this.  No changes to her medicines.  Follow-up with me in 2 years or sooner as necessary  Lori Casino, MD, [facility], [place] Director of the Advanced Lipid Disorders &  Cardiovascular Risk Reduction Clinic Diplomate of the American Board of Clinical Lipidology Attending Cardiologist  Direct Dial: [phone]  Fax: [phone]  Website:  [email or web address] 03/09/2020, 9:09 AM

## 2020-05-06 DIAGNOSIS — H5203 Hypermetropia, bilateral: Secondary | ICD-10-CM | POA: Diagnosis not present

## 2020-05-06 DIAGNOSIS — Z961 Presence of intraocular lens: Secondary | ICD-10-CM | POA: Diagnosis not present

## 2020-05-06 DIAGNOSIS — H35413 Lattice degeneration of retina, bilateral: Secondary | ICD-10-CM | POA: Diagnosis not present

## 2020-05-06 DIAGNOSIS — E119 Type 2 diabetes mellitus without complications: Secondary | ICD-10-CM | POA: Diagnosis not present

## 2020-05-25 DIAGNOSIS — I10 Essential (primary) hypertension: Secondary | ICD-10-CM | POA: Diagnosis not present

## 2020-05-25 DIAGNOSIS — Z86711 Personal history of pulmonary embolism: Secondary | ICD-10-CM | POA: Diagnosis not present

## 2020-05-25 DIAGNOSIS — E039 Hypothyroidism, unspecified: Secondary | ICD-10-CM | POA: Diagnosis not present

## 2020-05-25 DIAGNOSIS — R7303 Prediabetes: Secondary | ICD-10-CM | POA: Diagnosis not present

## 2020-05-25 DIAGNOSIS — E782 Mixed hyperlipidemia: Secondary | ICD-10-CM | POA: Diagnosis not present

## 2020-05-25 DIAGNOSIS — R7301 Impaired fasting glucose: Secondary | ICD-10-CM | POA: Diagnosis not present

## 2020-05-25 DIAGNOSIS — Z7901 Long term (current) use of anticoagulants: Secondary | ICD-10-CM | POA: Diagnosis not present

## 2020-06-22 ENCOUNTER — Other Ambulatory Visit: Payer: Self-pay | Admitting: Internal Medicine

## 2020-06-22 DIAGNOSIS — Z1231 Encounter for screening mammogram for malignant neoplasm of breast: Secondary | ICD-10-CM

## 2020-07-13 DIAGNOSIS — L821 Other seborrheic keratosis: Secondary | ICD-10-CM | POA: Diagnosis not present

## 2020-07-13 DIAGNOSIS — L814 Other melanin hyperpigmentation: Secondary | ICD-10-CM | POA: Diagnosis not present

## 2020-07-20 ENCOUNTER — Emergency Department (HOSPITAL_COMMUNITY): Payer: Medicare HMO

## 2020-07-20 ENCOUNTER — Encounter (HOSPITAL_COMMUNITY): Payer: Self-pay | Admitting: *Deleted

## 2020-07-20 ENCOUNTER — Emergency Department (HOSPITAL_COMMUNITY)
Admission: EM | Admit: 2020-07-20 | Discharge: 2020-07-20 | Disposition: A | Discharge status: Home / Self Care | Payer: Medicare HMO | Attending: Emergency Medicine | Admitting: Emergency Medicine

## 2020-07-20 DIAGNOSIS — S0992XA Unspecified injury of nose, initial encounter: Secondary | ICD-10-CM | POA: Diagnosis not present

## 2020-07-20 DIAGNOSIS — Z8709 Personal history of other diseases of the respiratory system: Secondary | ICD-10-CM | POA: Diagnosis not present

## 2020-07-20 DIAGNOSIS — Z79899 Other long term (current) drug therapy: Secondary | ICD-10-CM | POA: Diagnosis not present

## 2020-07-20 DIAGNOSIS — Z853 Personal history of malignant neoplasm of breast: Secondary | ICD-10-CM | POA: Insufficient documentation

## 2020-07-20 DIAGNOSIS — E039 Hypothyroidism, unspecified: Secondary | ICD-10-CM | POA: Diagnosis not present

## 2020-07-20 DIAGNOSIS — I130 Hypertensive heart and chronic kidney disease with heart failure and stage 1 through stage 4 chronic kidney disease, or unspecified chronic kidney disease: Secondary | ICD-10-CM | POA: Insufficient documentation

## 2020-07-20 DIAGNOSIS — N182 Chronic kidney disease, stage 2 (mild): Secondary | ICD-10-CM | POA: Insufficient documentation

## 2020-07-20 DIAGNOSIS — S0990XA Unspecified injury of head, initial encounter: Secondary | ICD-10-CM | POA: Diagnosis not present

## 2020-07-20 DIAGNOSIS — S0993XA Unspecified injury of face, initial encounter: Secondary | ICD-10-CM

## 2020-07-20 DIAGNOSIS — W1839XA Other fall on same level, initial encounter: Secondary | ICD-10-CM | POA: Insufficient documentation

## 2020-07-20 DIAGNOSIS — I5032 Chronic diastolic (congestive) heart failure: Secondary | ICD-10-CM | POA: Diagnosis not present

## 2020-07-20 DIAGNOSIS — S0083XA Contusion of other part of head, initial encounter: Secondary | ICD-10-CM | POA: Insufficient documentation

## 2020-07-20 DIAGNOSIS — Z7901 Long term (current) use of anticoagulants: Secondary | ICD-10-CM | POA: Insufficient documentation

## 2020-07-20 DIAGNOSIS — T148XXA Other injury of unspecified body region, initial encounter: Secondary | ICD-10-CM

## 2020-07-20 NOTE — ED Triage Notes (Signed)
Pt fell down when a bug startled her. She fell onto her face. She takes xeralto. No loss of consciousness.

## 2020-07-20 NOTE — ED Provider Notes (Signed)
Emergency Medicine Provider Triage Evaluation Note  Lori Page , a 85 y.o. female  was evaluated in triage.  Pt complains of patient complains of pain to lips and nose after suffering a mechanical fall.  Reports that fall occurred approximately 1330.  Patient fell with face sitting down only on floor.  Patient denies any loss of consciousness.  Denies any neck or back pain.  Patient is on Xarelto.  Patient takes this medication with dinner.  Review of Systems  Positive: Facial swelling, facial pain Negative: LOC, numbness, weakness, neck pain, back pain  Physical Exam  BP (!) 182/107 (BP Location: Right Arm)   Pulse 79   Temp 98.5 F (36.9 C) (Oral)   Resp 16   Ht 5' 5.5" (1.664 m)   Wt 98.9 kg   SpO2 99%   BMI 35.73 kg/m  Gen:   Awake, no distress   Resp:  Normal effort  MSK:   Moves extremities without difficulty  Other:  No midline tenderness or deformity to cervical, thoracic, or lumbar spine.  Patient noted to have facial swelling to nose, bilateral cheeks, and lower lip.  Patient has laceration to upper lip.  Patient has hematoma to of lower lip.  Patient able to handle oral secretions without difficulty  Medical Decision Making  Medically screening exam initiated at 4:01 PM.  Appropriate orders placed.  Lori Page was informed that the remainder of the evaluation will be completed by another provider, this initial triage assessment does not replace that evaluation, and the importance of remaining in the ED until their evaluation is complete.  The patient appears stable so that the remainder of the work up may be completed by another provider.      Loni Beckwith, PA-C 07/20/20 1608    Daleen Bo, MD 07/21/20 [phone]

## 2020-07-20 NOTE — ED Provider Notes (Signed)
  Face-to-face evaluation   History: Elderly female, who injured her self, earlier today when she was chasing a bug, then backed away as it came toward her, and when she turned she fell, striking her chin on the floor.  She was able to get up on her own came in by private vehicle.  She denies headache or neck pain at this time.  She denies weakness or numbness in her arms or legs.  There was no loss of consciousness.  There is been no change in behavior according to her husband who was in the room with her.  Physical exam: Elderly, overweight female.  She is alert cooperative.  She has a contusion of her chin, with a mucosal hematoma, in the lower anterior gutter.  No active intraoral bleeding.  No trismus.  No respiratory distress.  No visible injury to the scalp or cranium.  Medical screening examination/treatment/procedure(s) were conducted as a shared visit with non-physician practitioner(s) and myself.  I personally evaluated the patient during the encounter    Daleen Bo, MD 07/21/20 [phone]

## 2020-07-20 NOTE — ED Provider Notes (Signed)
[facility] DEPT Provider Note   CSN: [id number] Arrival date & time: 07/20/20  1536     History Chief Complaint  Patient presents with  . Facial Injury    Lori Page is a 85 y.o. female with a history of hypertension, CKD, chronic diastolic CHF, right bundle branch block.  Patient presents to the emergency department with facial injury after suffering a mechanical fall earlier today.  Patient reports that she was chasing a bug when it came towards her causing her to trip and fall landing face first on a linoleum surface.  Patient denies any loss of consciousness.  Patient endorses pain to her lips, chin, and nose.  Patient rates pain 7/10 on the pain scale.  Pain is aggravated with touch.  No alleviating factors.  Patient has not tried any make medication to improve her symptoms.  Patient denies any neck pain, back pain, weakness, numbness, bowel or bladder dysfunction, saddle anesthesia, visual disturbance, nausea, vomiting, trismus, trouble breathing, shortness of breath, drooling, dental tenderness, or dental abnormality.  Patient is on Xarelto.  Patient reports that she takes this medication with dinner.  HPI     Past Medical History:  Diagnosis Date  . Breast cancer, right breast ([place])   . DVT (deep venous thrombosis) ([facility])    LLE  . DVT, lower extremity, recurrent ([place])   . Essential thrombocytosis ([place]) 06/08/2011  . Headache    "a few/month" (05/04/2016)  . Hypertension   . Hypothyroidism     Patient Active Problem List   Diagnosis Date Noted  . Cellulitis of right lower extremity 05/04/2016  . DVT, lower extremity, recurrent ([place]) 05/04/2016  . Renal insufficiency 05/04/2016  . Sepsis due to undetermined organism ([place]) 11/09/2014  . Leukocytosis 11/09/2014  . Essential hypertension 11/09/2014  . Dyslipidemia 11/09/2014  . Hypokalemia 11/09/2014  . CKD (chronic kidney disease) stage 2, GFR 60-89 ml/min 11/09/2014  . Chronic  diastolic CHF (congestive heart failure) ([place]) 11/09/2014  . Near syncope 11/09/2014  . Fall 11/09/2014  . RBBB 12/29/2013  . Personal history of venous thrombosis and embolism 10/23/2013  . Essential thrombocytosis ([place]) 06/08/2011  . Breast cancer of lower-inner quadrant of right female breast ([place]) 10/08/1982    Past Surgical History:  Procedure Laterality Date  . BREAST BIOPSY Right ~ 1985  . CATARACT EXTRACTION W/ INTRAOCULAR LENS  IMPLANT, BILATERAL Bilateral 2000s  . DILATION AND CURETTAGE OF UTERUS    . MASTECTOMY Right ~ 1985  . REDUCTION MAMMAPLASTY Left   . VAGINAL HYSTERECTOMY       OB History   No obstetric history on file.     Family History  Problem Relation Age of Onset  . Heart disease Mother   . Heart attack Mother   . Cancer Father   . Stroke Brother   . Heart disease Brother   . Diabetes Brother     Social History   Tobacco Use  . Smoking status: Never Smoker  . Smokeless tobacco: Never Used  Substance Use Topics  . Alcohol use: No  . Drug use: No    Home Medications Prior to Admission medications   Medication Sig Start Date End Date Taking? Authorizing Provider  hydroxyurea (HYDREA) 500 MG capsule Take 1 capsule (500 mg total) by mouth 4 (four) times a week. May take with food to minimize GI side effects. 02/24/20   Nicholas Lose, MD  levothyroxine (SYNTHROID, LEVOTHROID) 25 MCG tablet Take 25 mcg by mouth daily before breakfast.  [provider]  metoprolol succinate (TOPROL-XL) 100 MG 24 hr tablet Take 100 mg by mouth daily. Take with or immediately following a meal.    [provider]  OZEMPIC, 0.25 OR 0.5 MG/DOSE, 2 MG/1.5ML SOPN Inject into the skin once a week. 02/24/20   [provider]  pravastatin (PRAVACHOL) 80 MG tablet Take 80 mg by mouth every evening.  12/23/13   [provider]  rivaroxaban (XARELTO) 20 MG TABS tablet Take 20 mg by mouth daily with supper.    [provider]   triamterene-hydrochlorothiazide (MAXZIDE-25) 37.5-25 MG tablet Take 1 tablet by mouth daily.    [provider]    Allergies    Patient has no known allergies.  Review of Systems   Review of Systems  Constitutional: Negative for chills and fever.  HENT: Positive for facial swelling. Negative for drooling, nosebleeds and trouble swallowing.   Eyes: Negative for visual disturbance.  Gastrointestinal: Negative for abdominal pain, nausea and vomiting.  Genitourinary: Negative for difficulty urinating.  Musculoskeletal: Negative for back pain and neck pain.  Skin: Positive for wound. Negative for color change and rash.  Neurological: Negative for dizziness, tremors, seizures, syncope, facial asymmetry, speech difficulty, weakness, light-headedness, numbness and headaches.  Psychiatric/Behavioral: Negative for confusion.    Physical Exam Updated Vital Signs BP (!) 181/95   Pulse 67   Temp 98.5 F (36.9 C) (Oral)   Resp 16   Ht 5' 5.5" (1.664 m)   Wt 98.9 kg   SpO2 98%   BMI 35.73 kg/m   Physical Exam Vitals and nursing note reviewed.  Constitutional:      General: She is not in acute distress.    Appearance: She is not ill-appearing, toxic-appearing or diaphoretic.  HENT:     Head: Normocephalic. Contusion present. No raccoon eyes, Battle's sign, abrasion, masses, right periorbital erythema, left periorbital erythema or laceration.     Jaw: There is normal jaw occlusion. No trismus, tenderness or pain on movement.     Comments: Contusion to bilateral cheeks    Nose: Signs of injury and nasal tenderness present. No nasal deformity, laceration or rhinorrhea.     Comments: Contusion and tenderness to right side of nose    Mouth/Throat:     Mouth: Mucous membranes are moist. Injury present.     Dentition: Normal dentition. No dental tenderness.     Pharynx: Oropharynx is clear. Uvula midline. No pharyngeal swelling, oropharyngeal exudate, posterior oropharyngeal  erythema or uvula swelling.     Comments: Abrasion to lateral aspect of upper lip Contusion of her chin, with a mucosal hematoma, in the lower anterior gutter.  No active injury or oral hemorrhaging Patient able to handle oral secretions without difficulty Eyes:     General: No scleral icterus.       Right eye: No discharge.        Left eye: No discharge.     Extraocular Movements: Extraocular movements intact.     Conjunctiva/sclera:     Right eye: Right conjunctiva is not injected. No chemosis, exudate or hemorrhage.    Left eye: Left conjunctiva is not injected. No chemosis, exudate or hemorrhage.    Pupils: Pupils are equal, round, and reactive to light.  Cardiovascular:     Rate and Rhythm: Normal rate.  Pulmonary:     Effort: Pulmonary effort is normal.     Comments: Patient able to speak in full complete sentences without difficulty Abdominal:     Palpations: Abdomen is  soft.     Tenderness: There is no abdominal tenderness.  Musculoskeletal:     Cervical back: Normal range of motion and neck supple. No edema, erythema, signs of trauma, lacerations, rigidity, torticollis or crepitus. No pain with movement, spinous process tenderness or muscular tenderness. Normal range of motion.     Comments: No midline tenderness or deformity to cervical, thoracic, or lumbar spine. Patient has no tenderness, deformity, swelling, edema, or trauma to cervical, thoracic, or lumbar back. Patient has no tenderness, bony tenderness, or deformity to bilateral upper or lower extremities.  Skin:    General: Skin is warm and dry.  Neurological:     General: No focal deficit present.     Mental Status: She is alert and oriented to person, place, and time.     GCS: GCS eye subscore is 4. GCS verbal subscore is 5. GCS motor subscore is 6.     Cranial Nerves: No cranial nerve deficit or facial asymmetry.     Sensory: Sensation is intact.     Motor: No weakness, tremor, seizure activity or pronator drift.      Coordination: Finger-Nose-Finger Test normal.     Comments: CN II-XII intact; performed in supine position, +5 strength to bilateral upper extremities, +5 strength to dorsiflexion and plantarflexion, patient able to left both legs against gravity and hold each there without difficulty.  Sensation to light touch intact to bilateral upper and lower extremities  Psychiatric:        Behavior: Behavior is cooperative.     ED Results / Procedures / Treatments   Labs (all labs ordered are listed, but only abnormal results are displayed) Labs Reviewed - No data to display  EKG None  Radiology CT Head Wo Contrast  Result Date: 07/20/2020 CLINICAL DATA:  Head trauma.  Mechanical fall. EXAM: CT HEAD WITHOUT CONTRAST TECHNIQUE: Contiguous axial images were obtained from the base of the skull through the vertex without intravenous contrast. COMPARISON:  Head CT 11/08/2014 FINDINGS: Brain: Brain volume is normal for age. No intracranial hemorrhage, mass effect, or midline shift. No hydrocephalus. The basilar cisterns are patent. No evidence of territorial infarct or acute ischemia. No extra-axial or intracranial fluid collection. Vascular: Atherosclerosis of skullbase vasculature without hyperdense vessel or abnormal calcification. Skull: No fracture or focal lesion. Sinuses/Orbits: Assessed on concurrent face CT, reported separately. Other: None. IMPRESSION: No acute intracranial abnormality. No skull fracture. Electronically Signed   By: Keith Rake M.D.   On: 07/20/2020 18:15   CT Maxillofacial WO CM  Result Date: 07/20/2020 CLINICAL DATA:  Facial trauma.  Nose and lip pain. EXAM: CT MAXILLOFACIAL WITHOUT CONTRAST TECHNIQUE: Multidetector CT imaging of the maxillofacial structures was performed. Multiplanar CT image reconstructions were also generated. COMPARISON:  None. FINDINGS: Osseous: No acute fracture of the nasal bone, zygomatic arches, mandibles, or maxilla. Trace rightward bowing of the  nasal septum. The temporomandibular joints are congruent. Teeth appear intact. Orbits: No orbital fracture or globe injury. Bilateral cataract resection. Sinuses: No sinus fracture or fluid levels. Trace mucosal thickening of ethmoid air cells. Soft tissues: Right nasal soft tissue edema. Soft tissue thickening about the chin. Limited intracranial: Assessed on concurrent head CT, reported separately. IMPRESSION: 1. Right nasal soft tissue edema. No nasal bone or acute facial fracture. 2. Soft tissue thickening about the chin. Electronically Signed   By: Keith Rake M.D.   On: 07/20/2020 18:12    Procedures Procedures   Medications Ordered in ED Medications - No data to display  ED Course  I have reviewed the triage vital signs and the nursing notes.  Pertinent labs & imaging results that were available during my care of the patient were reviewed by me and considered in my medical decision making (see chart for details).    MDM Rules/Calculators/A&P                          Alert 85 year old female no acute distress, nontoxic-appearing.  Patient presents with chief complaint of facial injury after sustaining a mechanical fall earlier today.  Patient denies any loss of consciousness.  Patient denies any neck pain, back pain, bowel or bladder dysfunction, saddle anesthesia, visual disturbance, numbness, weakness, facial asymmetry, slurred speech, nausea, vomiting.  Patient has full range of motion of her neck.  No midline tenderness or deformity to cervical, thoracic, or lumbar spine.  Patient has no focal neurological deficits on exam.  Low suspicion for spinal injury at this time.  Noncontrast head CT and maxillofacial CT were obtained while patient was in triage.  Noncontrast head CT shows no acute intracranial abnormality.  No skull fracture. Noncontrast maxillofacial CT showed right nasal soft tissue edema.  No nasal bone or acute facial fracture.  Soft tissue thickening about the  chin.  Abrasion to lateral aspect of upper lip.  Contusion of her chin, with a mucosal hematoma, in the lower anterior gutter.  No active injury or oral hemorrhaging.  Patient able to handle oral secretions without difficulty.  Patient able to speak in full sentences without difficulty.  Patient hemodynamically stable at this time.  Will discharge patient.  Patient given strict return precautions.  Patient advised to use Tylenol as needed for pain management.  Patient expressed understanding of all instructions and is agreeable with this plan.  Patient was discussed with and evaluated by Dr. Eulis Foster.   Final Clinical Impression(s) / ED Diagnoses Final diagnoses:  Facial injury, initial encounter  Hematoma    Rx / DC Orders ED Discharge Orders    None       Loni Beckwith, PA-C 07/21/20 0249    Daleen Bo, MD 07/21/20 1128

## 2020-07-20 NOTE — Discharge Instructions (Signed)
You came to the emergency department today to be evaluated for your facial swelling after suffering a fall.  The CT scan of your head showed no bleeds or skull fracture.  The CT scan of your face showed no broken bones.  You have significant swelling due to a hematoma on your bottom lip.  If this starts bleeding and you cannot control the bleeding with direct pressure please return to the emergency department.  Please ice the affected area for 20 minutes at a time and then allow for 20-minute rest.  Swelling should improve gradually over the next few days.  Please take Tylenol (acetaminophen) to relieve your pain.  You make take tylenol, up to 1,000 mg (two extra strength pills) every 8 hours as needed. Do not take more than 3,000 mg tylenol in a 24 hour period (not more than one dose every 8 hours.  Please check all medication labels as many medications such as pain and cold medications may contain tylenol.  Do not drink alcohol while taking these medications.    Get help right away if: You have: A severe headache that is not helped by medicine. Trouble walking or weakness in your arms and legs. Clear or bloody fluid coming from your nose or ears. Changes in your vision. A seizure. Increased confusion or irritability. Your symptoms get worse. You are sleepier than normal and have trouble staying awake. You lose your balance. Your pupils change size. Your speech is slurred. Your dizziness gets worse. You vomit.

## 2020-07-26 DIAGNOSIS — S0993XA Unspecified injury of face, initial encounter: Secondary | ICD-10-CM | POA: Diagnosis not present

## 2020-07-26 DIAGNOSIS — Z9181 History of falling: Secondary | ICD-10-CM | POA: Diagnosis not present

## 2020-07-26 DIAGNOSIS — Z09 Encounter for follow-up examination after completed treatment for conditions other than malignant neoplasm: Secondary | ICD-10-CM | POA: Diagnosis not present

## 2020-08-19 ENCOUNTER — Other Ambulatory Visit: Payer: Self-pay

## 2020-08-19 ENCOUNTER — Other Ambulatory Visit: Payer: Self-pay | Admitting: Internal Medicine

## 2020-08-19 ENCOUNTER — Ambulatory Visit
Admission: RE | Admit: 2020-08-19 | Discharge: 2020-08-19 | Disposition: A | Discharge status: Home / Self Care | Payer: Medicare HMO | Source: Ambulatory Visit | Attending: Internal Medicine | Admitting: Internal Medicine

## 2020-08-19 DIAGNOSIS — Z1231 Encounter for screening mammogram for malignant neoplasm of breast: Secondary | ICD-10-CM | POA: Diagnosis not present

## 2020-08-24 DIAGNOSIS — Z7901 Long term (current) use of anticoagulants: Secondary | ICD-10-CM | POA: Diagnosis not present

## 2020-08-24 DIAGNOSIS — I10 Essential (primary) hypertension: Secondary | ICD-10-CM | POA: Diagnosis not present

## 2020-08-24 DIAGNOSIS — E039 Hypothyroidism, unspecified: Secondary | ICD-10-CM | POA: Diagnosis not present

## 2020-08-24 DIAGNOSIS — R7301 Impaired fasting glucose: Secondary | ICD-10-CM | POA: Diagnosis not present

## 2020-08-24 DIAGNOSIS — R7303 Prediabetes: Secondary | ICD-10-CM | POA: Diagnosis not present

## 2020-08-24 DIAGNOSIS — E782 Mixed hyperlipidemia: Secondary | ICD-10-CM | POA: Diagnosis not present

## 2020-08-24 DIAGNOSIS — Z86711 Personal history of pulmonary embolism: Secondary | ICD-10-CM | POA: Diagnosis not present

## 2020-10-29 DIAGNOSIS — Z23 Encounter for immunization: Secondary | ICD-10-CM | POA: Diagnosis not present

## 2021-01-31 DIAGNOSIS — E7801 Familial hypercholesterolemia: Secondary | ICD-10-CM | POA: Diagnosis not present

## 2021-01-31 DIAGNOSIS — I10 Essential (primary) hypertension: Secondary | ICD-10-CM | POA: Diagnosis not present

## 2021-01-31 DIAGNOSIS — R7309 Other abnormal glucose: Secondary | ICD-10-CM | POA: Diagnosis not present

## 2021-01-31 DIAGNOSIS — E039 Hypothyroidism, unspecified: Secondary | ICD-10-CM | POA: Diagnosis not present

## 2021-02-09 DIAGNOSIS — Z79899 Other long term (current) drug therapy: Secondary | ICD-10-CM | POA: Diagnosis not present

## 2021-02-09 DIAGNOSIS — I5032 Chronic diastolic (congestive) heart failure: Secondary | ICD-10-CM | POA: Diagnosis not present

## 2021-02-09 DIAGNOSIS — E039 Hypothyroidism, unspecified: Secondary | ICD-10-CM | POA: Diagnosis not present

## 2021-02-09 DIAGNOSIS — Z86718 Personal history of other venous thrombosis and embolism: Secondary | ICD-10-CM | POA: Diagnosis not present

## 2021-02-09 DIAGNOSIS — Z0000 Encounter for general adult medical examination without abnormal findings: Secondary | ICD-10-CM | POA: Diagnosis not present

## 2021-02-09 DIAGNOSIS — J309 Allergic rhinitis, unspecified: Secondary | ICD-10-CM | POA: Diagnosis not present

## 2021-02-09 DIAGNOSIS — I10 Essential (primary) hypertension: Secondary | ICD-10-CM | POA: Diagnosis not present

## 2021-02-09 DIAGNOSIS — D6869 Other thrombophilia: Secondary | ICD-10-CM | POA: Diagnosis not present

## 2021-02-09 DIAGNOSIS — M810 Age-related osteoporosis without current pathological fracture: Secondary | ICD-10-CM | POA: Diagnosis not present

## 2021-02-21 NOTE — Assessment & Plan Note (Signed)
Lab review: Platelet count 620 (was 577), WBC 7.9, hemoglobin 14.6 Plan: We will increase the dosage of Hydrea to 4 times a week.  Goal of treatment: To keep platelet count less than 600 Hydroxyurea toxicities: Being monitored, no adverse effects  continue with anticoagulation for history of recurrent blood clots.  Breast cancer of lower-inner quadrant of right female breast Stage I right breast cancer diagnosed in 1984: Status post mastectomy, adjuvant chemotherapy and adjuvant antiestrogen therapy. Currently on surveillance  Breast Cancer Surveillance: 1. Breast exam1/11/2020: Benign no palpable lumps or nodules 2. Mammogram left breast7/11/22:Benign. Breast Density Category B.  Return to clinic in 1 year for follow-up

## 2021-02-22 ENCOUNTER — Other Ambulatory Visit: Payer: Self-pay

## 2021-02-22 ENCOUNTER — Inpatient Hospital Stay: Payer: Medicare HMO

## 2021-02-22 ENCOUNTER — Inpatient Hospital Stay: Payer: Medicare HMO | Attending: Hematology and Oncology | Admitting: Hematology and Oncology

## 2021-02-22 DIAGNOSIS — Z7901 Long term (current) use of anticoagulants: Secondary | ICD-10-CM | POA: Diagnosis not present

## 2021-02-22 DIAGNOSIS — Z9011 Acquired absence of right breast and nipple: Secondary | ICD-10-CM | POA: Diagnosis not present

## 2021-02-22 DIAGNOSIS — D473 Essential (hemorrhagic) thrombocythemia: Secondary | ICD-10-CM | POA: Diagnosis not present

## 2021-02-22 DIAGNOSIS — Z853 Personal history of malignant neoplasm of breast: Secondary | ICD-10-CM | POA: Insufficient documentation

## 2021-02-22 DIAGNOSIS — Z79899 Other long term (current) drug therapy: Secondary | ICD-10-CM | POA: Diagnosis not present

## 2021-02-22 LAB — CBC WITH DIFFERENTIAL (CANCER CENTER ONLY)
Abs Immature Granulocytes: 0.01 10*3/uL (ref 0.00–0.07)
Basophils Absolute: 0.1 10*3/uL (ref 0.0–0.1)
Basophils Relative: 1 %
Eosinophils Absolute: 0.2 10*3/uL (ref 0.0–0.5)
Eosinophils Relative: 2 %
HCT: 45.6 % (ref 36.0–46.0)
Hemoglobin: 14.7 g/dL (ref 12.0–15.0)
Immature Granulocytes: 0 %
Lymphocytes Relative: 32 %
Lymphs Abs: 2.1 10*3/uL (ref 0.7–4.0)
MCH: 28.7 pg (ref 26.0–34.0)
MCHC: 32.2 g/dL (ref 30.0–36.0)
MCV: 88.9 fL (ref 80.0–100.0)
Monocytes Absolute: 0.5 10*3/uL (ref 0.1–1.0)
Monocytes Relative: 8 %
Neutro Abs: 3.8 10*3/uL (ref 1.7–7.7)
Neutrophils Relative %: 57 %
Platelet Count: 627 10*3/uL — ABNORMAL HIGH (ref 150–400)
RBC: 5.13 MIL/uL — ABNORMAL HIGH (ref 3.87–5.11)
RDW: 16.2 % — ABNORMAL HIGH (ref 11.5–15.5)
WBC Count: 6.6 10*3/uL (ref 4.0–10.5)
nRBC: 0 % (ref 0.0–0.2)

## 2021-02-22 MED ORDER — HYDROXYUREA 500 MG PO CAPS: 500.0000 mg | ORAL_CAPSULE | ORAL | 3 refills | Status: DC

## 2021-02-22 NOTE — Progress Notes (Signed)
Patient Care Team: Deland Pretty, MD as PCP - General (Internal Medicine) Debara Pickett Nadean Corwin, MD as PCP - Cardiology (Cardiology)  DIAGNOSIS:  Encounter Diagnosis  Name Primary?   Essential thrombocytosis ([place])     SUMMARY OF ONCOLOGIC HISTORY: Oncology History  Breast cancer of lower-inner quadrant of right female breast ([place])  09/23/1982 Surgery   Right mastectomy foll by reconstruction; Stage 1   10/24/1982 - 03/26/1983 Chemotherapy   Chemotherapy X 6 months   04/23/1983 - 10/23/1988 Anti-estrogen oral therapy   Tamoxifen 20 mg daily   Essential thrombocytosis ([place])  04/26/2010 Procedure   Bone marrow biopsy: Hypercellular with features of myeloproliferative neoplasm differential diagnosis ET vs P. vera   04/26/2010 -  Chemotherapy   Hydrea 500 mg twice daily; later changed to alternate once daily and twice daily (due to leg swelling); changed to once daily 10/24/13; decrease to 3 times a week on 02/22/2016     CHIEF COMPLIANT: Follow-up of essential thrombocytosis.  Remote history of breast cancer  INTERVAL HISTORY: Lori Page is a 86 year old with above-mentioned history of breast cancer long time ago in 1984 who had mastectomy chemotherapy and tamoxifen.  She is currently being followed for essential thrombocytosis that was diagnosed in 2012.   ALLERGIES:  has No Known Allergies.  MEDICATIONS:  Current Outpatient Medications  Medication Sig Dispense Refill   hydroxyurea (HYDREA) 500 MG capsule Take 1 capsule (500 mg total) by mouth 4 (four) times a week. May take with food to minimize GI side effects. 90 capsule 3   levothyroxine (SYNTHROID, LEVOTHROID) 25 MCG tablet Take 25 mcg by mouth daily before breakfast.     metoprolol succinate (TOPROL-XL) 100 MG 24 hr tablet Take 100 mg by mouth daily. Take with or immediately following a meal.     OZEMPIC, 0.25 OR 0.5 MG/DOSE, 2 MG/1.5ML SOPN Inject into the skin once a week.     pravastatin (PRAVACHOL) 80 MG tablet Take  80 mg by mouth every evening.   1   rivaroxaban (XARELTO) 20 MG TABS tablet Take 20 mg by mouth daily with supper.     triamterene-hydrochlorothiazide (MAXZIDE-25) 37.5-25 MG tablet Take 1 tablet by mouth daily.     No current facility-administered medications for this visit.    PHYSICAL EXAMINATION: ECOG PERFORMANCE STATUS: 1 - Symptomatic but completely ambulatory  Vitals:   02/22/21 0914  BP: (!) 158/74  Pulse: 73  Resp: 18  Temp: (!) 97.2 F (36.2 C)  SpO2: 96%   Filed Weights   02/22/21 0914  Weight: 217 lb 4.8 oz (98.6 kg)      LABORATORY DATA:  I have reviewed the data as listed CMP Latest Ref Rng & Units 02/20/2018 05/06/2016 05/05/2016  Glucose 70 - 99 mg/dL 121(H) 111(H) 121(H)  BUN 8 - 23 mg/dL _0 Creatinine 0.44 - 1.00 mg/dL 1.18(H) 0.94 0.97  Sodium 135 - 145 mmol/L 141 139 138  Potassium 3.5 - 5.1 mmol/L 3.6 3.7 3.2(L)  Chloride 98 - 111 mmol/L 105 109 108  CO2 22 - 32 mmol/L _1 Calcium 8.9 - 10.3 mg/dL 9.6 8.1(L) 7.8(L)  Total Protein 6.5 - 8.1 g/dL 6.8 - 5.6(L)  Total Bilirubin 0.3 - 1.2 mg/dL 0.4 - 0.7  Alkaline Phos 38 - 126 U/L 63 - 47  AST 15 - 41 U/L 21 - 33  ALT 0 - 44 U/L 16 - 17    Lab Results  Component Value Date   WBC 6.6 02/22/2021  HGB 14.7 02/22/2021   HCT 45.6 02/22/2021   MCV 88.9 02/22/2021   PLT 627 (H) 02/22/2021   NEUTROABS 3.8 02/22/2021    ASSESSMENT & PLAN:  Essential thrombocytosis ([facility]) Lab review: Platelet count 620 (was 577), WBC 7.9, hemoglobin 14.6 Plan: Currently on Hydrea 4 times a week.   Goal of treatment: To keep platelet count less than 600 Today's platelet count is 627 which is fairly stable compared to a year ago 620. We discussed the pros and cons of increasing the Hydrea dose and decided to keep with the same.  Hydroxyurea toxicities: Being monitored, no adverse effects   continue with anticoagulation for history of recurrent blood clots.   Breast cancer of lower-inner quadrant of right  female breast Stage I right breast cancer diagnosed in 1984: Status post mastectomy, adjuvant chemotherapy and adjuvant antiestrogen therapy.  Currently on surveillance   Breast Cancer Surveillance: 1. Breast exam 02/23/2020: Benign no palpable lumps or nodules 2. Mammogram left breast 08/23/20: Benign. Breast Density Category B.   Return to clinic in 1 year for follow-up   No orders of the defined types were placed in this encounter.  The patient has a good understanding of the overall plan. she agrees with it. she will call with any problems that may develop before the next visit here. Total time spent: 30 mins including face to face time and time spent for planning, charting and co-ordination of care   Harriette Ohara, MD 02/22/21

## 2021-04-19 ENCOUNTER — Other Ambulatory Visit: Payer: Self-pay | Admitting: Hematology and Oncology

## 2021-06-03 DIAGNOSIS — H5203 Hypermetropia, bilateral: Secondary | ICD-10-CM | POA: Diagnosis not present

## 2021-06-03 DIAGNOSIS — E119 Type 2 diabetes mellitus without complications: Secondary | ICD-10-CM | POA: Diagnosis not present

## 2021-06-03 DIAGNOSIS — Z961 Presence of intraocular lens: Secondary | ICD-10-CM | POA: Diagnosis not present

## 2021-07-07 ENCOUNTER — Other Ambulatory Visit: Payer: Self-pay | Admitting: Internal Medicine

## 2021-07-07 DIAGNOSIS — Z1231 Encounter for screening mammogram for malignant neoplasm of breast: Secondary | ICD-10-CM

## 2021-08-10 DIAGNOSIS — E039 Hypothyroidism, unspecified: Secondary | ICD-10-CM | POA: Diagnosis not present

## 2021-08-10 DIAGNOSIS — D6869 Other thrombophilia: Secondary | ICD-10-CM | POA: Diagnosis not present

## 2021-08-10 DIAGNOSIS — R7303 Prediabetes: Secondary | ICD-10-CM | POA: Diagnosis not present

## 2021-08-10 DIAGNOSIS — N1831 Chronic kidney disease, stage 3a: Secondary | ICD-10-CM | POA: Diagnosis not present

## 2021-08-10 DIAGNOSIS — I10 Essential (primary) hypertension: Secondary | ICD-10-CM | POA: Diagnosis not present

## 2021-08-10 DIAGNOSIS — I5032 Chronic diastolic (congestive) heart failure: Secondary | ICD-10-CM | POA: Diagnosis not present

## 2021-08-10 DIAGNOSIS — K130 Diseases of lips: Secondary | ICD-10-CM | POA: Diagnosis not present

## 2021-08-22 ENCOUNTER — Ambulatory Visit
Admission: RE | Admit: 2021-08-22 | Discharge: 2021-08-22 | Disposition: A | Discharge status: Home / Self Care | Payer: Medicare HMO | Source: Ambulatory Visit | Attending: Internal Medicine | Admitting: Internal Medicine

## 2021-08-22 DIAGNOSIS — Z1231 Encounter for screening mammogram for malignant neoplasm of breast: Secondary | ICD-10-CM | POA: Diagnosis not present

## 2021-09-05 IMAGING — MG DIGITAL SCREENING UNILAT LEFT W/ TOMO W/ CAD
4 series · 4 of 12 positions shown · non-contrast
Comparison: Previous exam(s).

CLINICAL DATA: Screening.

EXAM:
DIGITAL SCREENING UNILATERAL LEFT MAMMOGRAM WITH CAD AND
TOMOSYNTHESIS
TECHNIQUE: Left screening digital craniocaudal and mediolateral oblique
mammograms were obtained. Left screening digital breast
tomosynthesis was performed. The images were evaluated with
computer-aided detection.

[L MLO synth-2D]
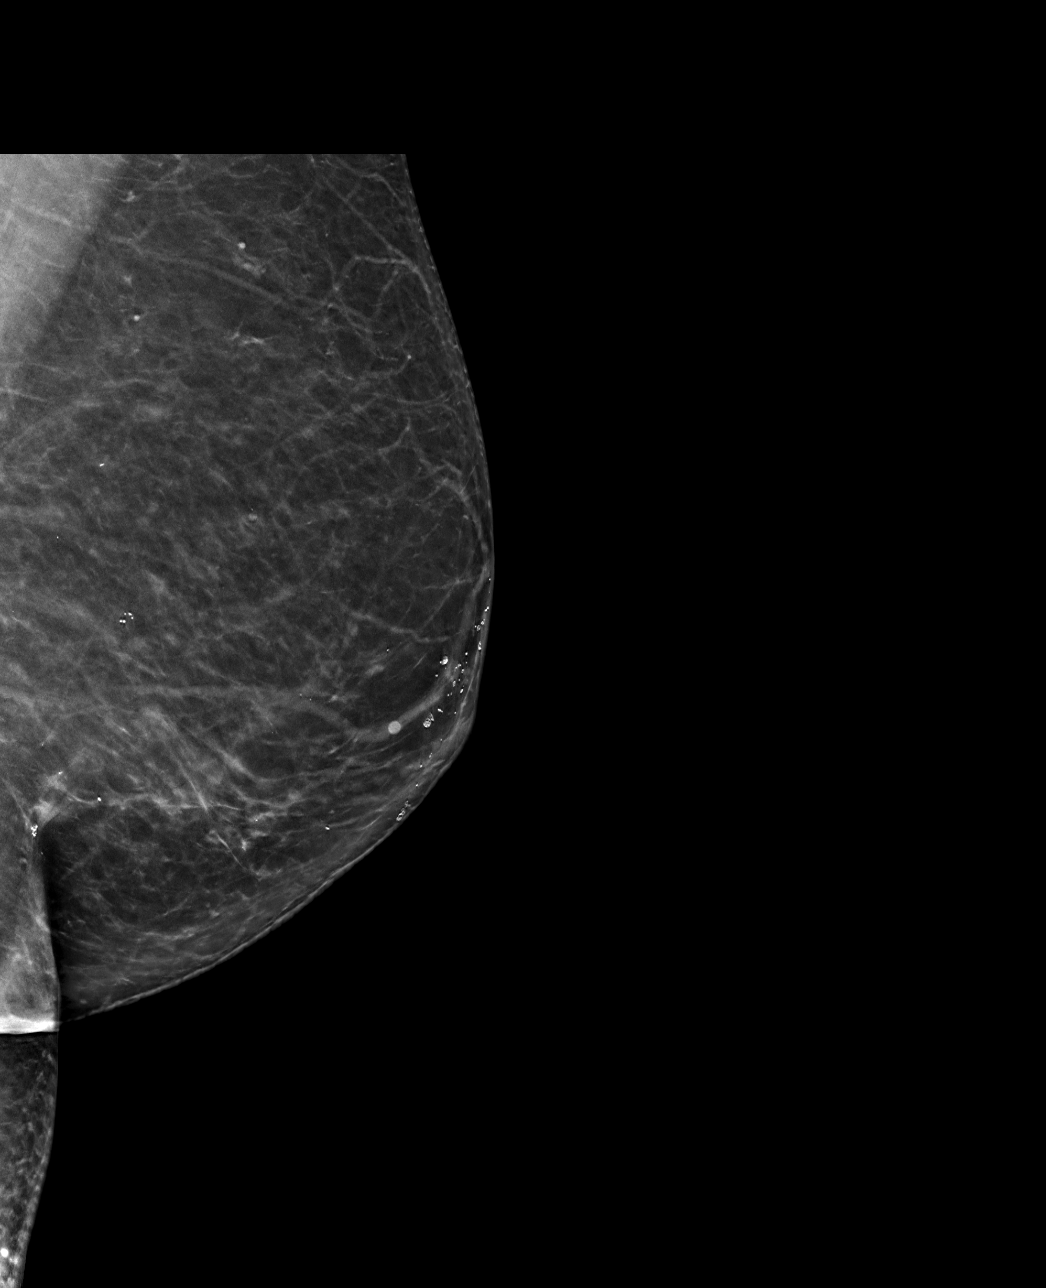

[L CC synth-2D]
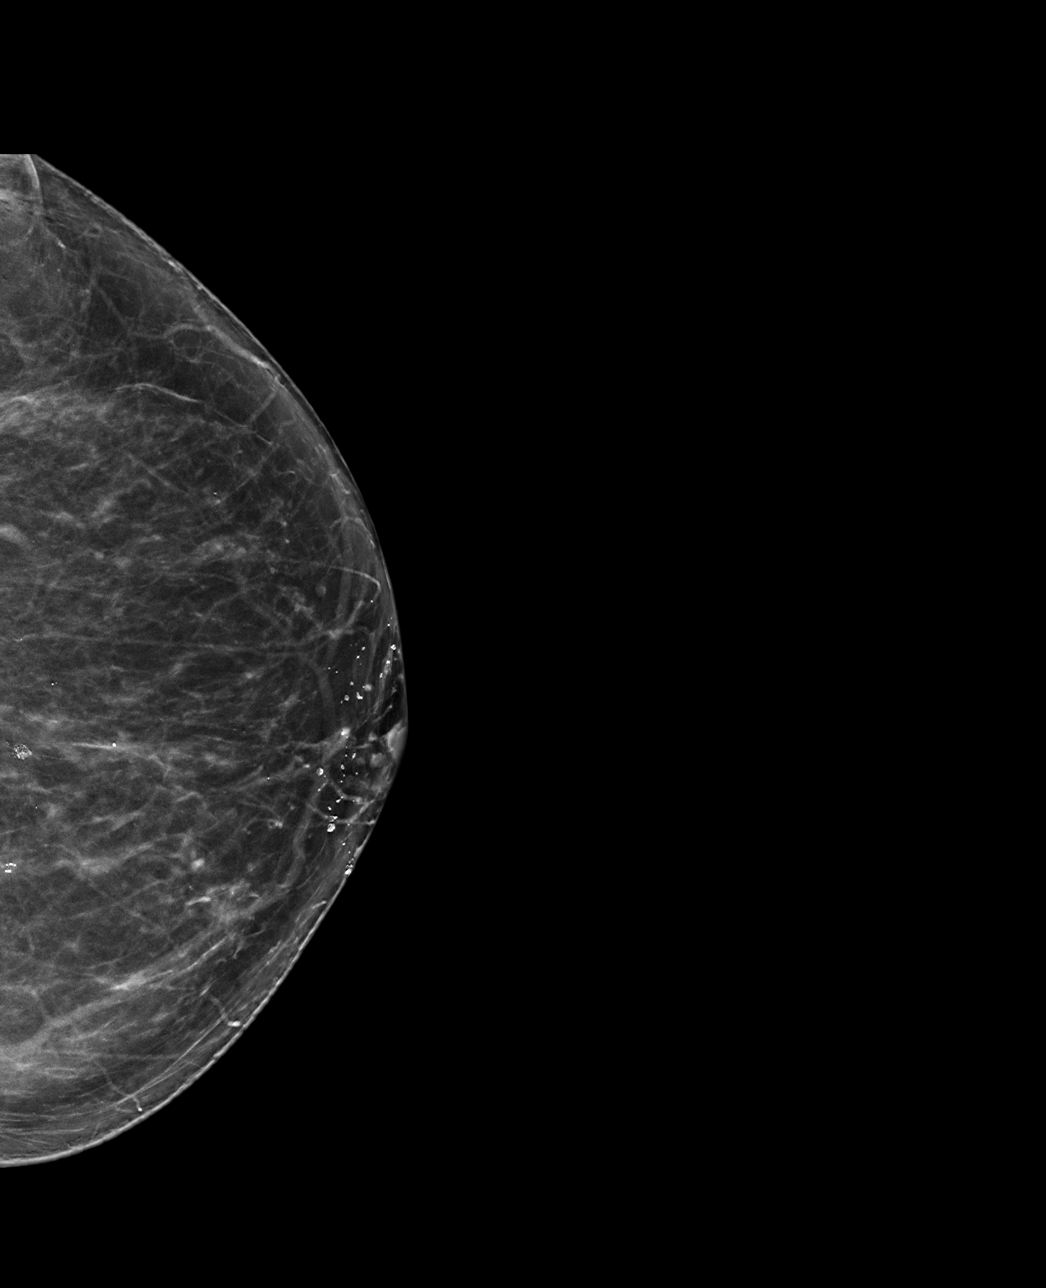

[L CC tomo · tomo slice 35/68.0]
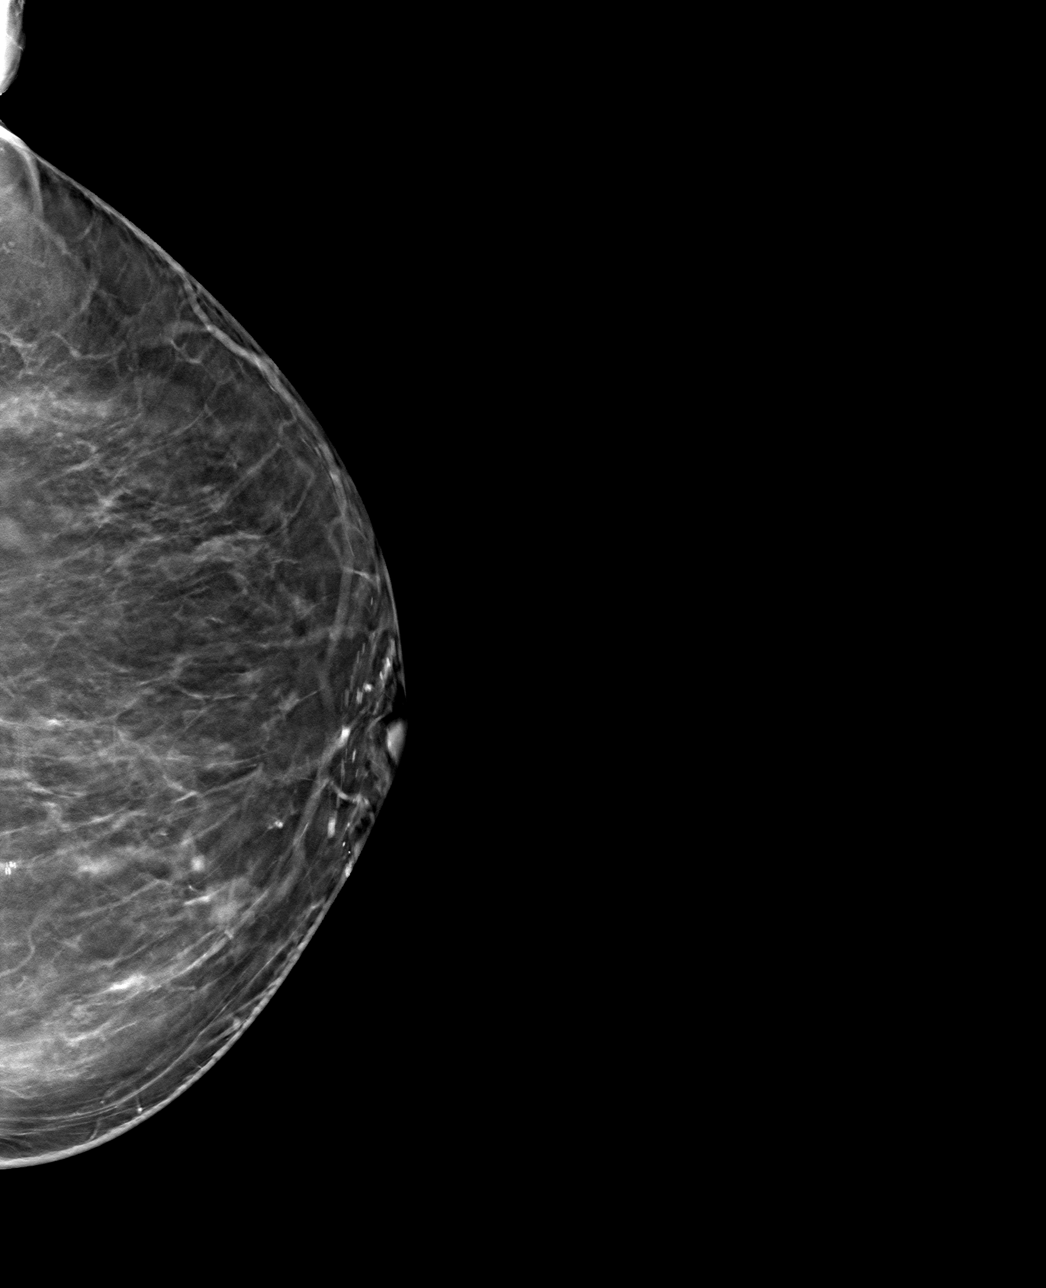

[L MLO tomo · tomo slice 37/73.0]
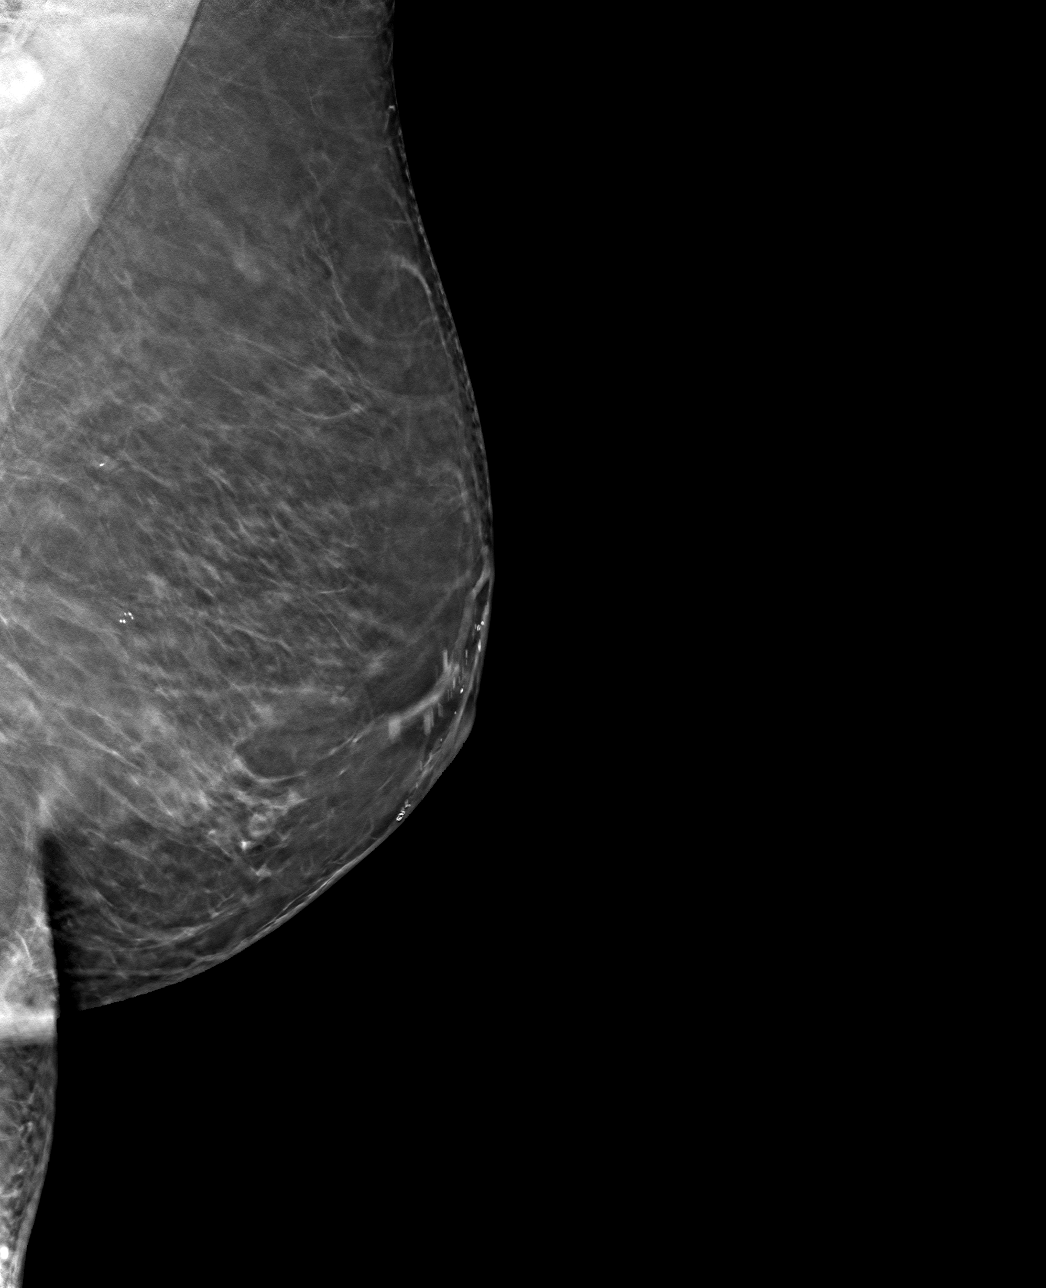

[4 of 12 positions shown; findings below may reference images not displayed]

ACR Breast Density Category b: There are scattered areas of
fibroglandular density.
FINDINGS: There are no findings suspicious for malignancy.
IMPRESSION: No mammographic evidence of malignancy. A result letter of this
screening mammogram will be mailed directly to the patient.

RECOMMENDATION:
Screening mammogram in one year. (Code:EO-6-XP6)

BI-RADS CATEGORY  1: Negative.

## 2021-11-11 DIAGNOSIS — Z23 Encounter for immunization: Secondary | ICD-10-CM | POA: Diagnosis not present

## 2022-01-09 DIAGNOSIS — R7303 Prediabetes: Secondary | ICD-10-CM | POA: Diagnosis not present

## 2022-01-09 DIAGNOSIS — E860 Dehydration: Secondary | ICD-10-CM | POA: Diagnosis not present

## 2022-01-09 DIAGNOSIS — R600 Localized edema: Secondary | ICD-10-CM | POA: Diagnosis not present

## 2022-01-25 DIAGNOSIS — M79662 Pain in left lower leg: Secondary | ICD-10-CM | POA: Diagnosis not present

## 2022-01-25 DIAGNOSIS — M7989 Other specified soft tissue disorders: Secondary | ICD-10-CM | POA: Diagnosis not present

## 2022-01-25 DIAGNOSIS — M79661 Pain in right lower leg: Secondary | ICD-10-CM | POA: Diagnosis not present

## 2022-01-25 DIAGNOSIS — I83813 Varicose veins of bilateral lower extremities with pain: Secondary | ICD-10-CM | POA: Diagnosis not present

## 2022-01-25 DIAGNOSIS — M79604 Pain in right leg: Secondary | ICD-10-CM | POA: Diagnosis not present

## 2022-01-25 DIAGNOSIS — I83893 Varicose veins of bilateral lower extremities with other complications: Secondary | ICD-10-CM | POA: Diagnosis not present

## 2022-02-09 DIAGNOSIS — E039 Hypothyroidism, unspecified: Secondary | ICD-10-CM | POA: Diagnosis not present

## 2022-02-09 DIAGNOSIS — I10 Essential (primary) hypertension: Secondary | ICD-10-CM | POA: Diagnosis not present

## 2022-02-14 NOTE — Progress Notes (Signed)
Patient Care Team: Deland Pretty, MD as PCP - General (Internal Medicine) Debara Pickett Nadean Corwin, MD as PCP - Cardiology (Cardiology)  DIAGNOSIS:  Encounter Diagnoses  Name Primary?   Essential thrombocytosis (HCC) Yes   Malignant neoplasm of lower-inner quadrant of right breast of female, estrogen receptor positive ([place])     SUMMARY OF ONCOLOGIC HISTORY: Oncology History  Breast cancer of lower-inner quadrant of right female breast ([place])  09/23/1982 Surgery   Right mastectomy foll by reconstruction; Stage 1   10/24/1982 - 03/26/1983 Chemotherapy   Chemotherapy X 6 months   04/23/1983 - 10/23/1988 Anti-estrogen oral therapy   Tamoxifen 20 mg daily   Essential thrombocytosis ([place])  04/26/2010 Procedure   Bone marrow biopsy: Hypercellular with features of myeloproliferative neoplasm differential diagnosis ET vs P. vera   04/26/2010 -  Chemotherapy   Hydrea 500 mg twice daily; later changed to alternate once daily and twice daily (due to leg swelling); changed to once daily 10/24/13; decrease to 3 times a week on 02/22/2016     CHIEF COMPLIANT: Follow-up of essential thrombocytosis. Remote history of breast cancer   INTERVAL HISTORY: Lori Page is a 87 year old with above-mentioned history of breast cancer.  She is tolerating Hydrea fairly well except some roughness on the inside of the lip as well as dryness of the mouth for which she is drinking lots of water.   ALLERGIES:  has No Known Allergies.  MEDICATIONS:  Current Outpatient Medications  Medication Sig Dispense Refill   hydroxyurea (HYDREA) 500 MG capsule Take 1 capsule (500 mg total) by mouth 4 (four) times a week. May take with food to minimize GI side effects. 90 capsule 3   levothyroxine (SYNTHROID, LEVOTHROID) 25 MCG tablet Take 25 mcg by mouth daily before breakfast.     metoprolol succinate (TOPROL-XL) 100 MG 24 hr tablet Take 100 mg by mouth daily. Take with or immediately following a meal.     pravastatin  (PRAVACHOL) 80 MG tablet Take 80 mg by mouth every evening.   1   rivaroxaban (XARELTO) 20 MG TABS tablet Take 20 mg by mouth daily with supper.     triamterene-hydrochlorothiazide (MAXZIDE-25) 37.5-25 MG tablet Take 1 tablet by mouth daily.     No current facility-administered medications for this visit.    PHYSICAL EXAMINATION: ECOG PERFORMANCE STATUS: 1 - Symptomatic but completely ambulatory  Vitals:   02/22/22 0921  BP: (!) 162/100  Pulse: 74  Resp: 18  Temp: (!) 97.5 F (36.4 C)  SpO2: 97%   Filed Weights   02/22/22 0921  Weight: 224 lb 9.6 oz (101.9 kg)    BREAST: No palpable masses or nodules in either right or left breasts. No palpable axillary supraclavicular or infraclavicular adenopathy no breast tenderness or nipple discharge. (exam performed in the presence of a chaperone)  LABORATORY DATA:  I have reviewed the data as listed    Latest Ref Rng & Units 02/20/2018    1:46 PM 05/06/2016    3:58 AM 05/05/2016    4:19 AM  CMP  Glucose 70 - 99 mg/dL 121  111  121   BUN 8 - 23 mg/dL _0 Creatinine 0.44 - 1.00 mg/dL 1.18  0.94  0.97   Sodium 135 - 145 mmol/L 141  139  138   Potassium 3.5 - 5.1 mmol/L 3.6  3.7  3.2   Chloride 98 - 111 mmol/L 105  109  108   CO2 22 - 32 mmol/L 27  24  23   Calcium 8.9 - 10.3 mg/dL 9.6  8.1  7.8   Total Protein 6.5 - 8.1 g/dL 6.8   5.6   Total Bilirubin 0.3 - 1.2 mg/dL 0.4   0.7   Alkaline Phos 38 - 126 U/L 63   47   AST 15 - 41 U/L 21   33   ALT 0 - 44 U/L 16   17     Lab Results  Component Value Date   WBC 6.9 02/22/2022   HGB 15.4 (H) 02/22/2022   HCT 46.2 (H) 02/22/2022   MCV 89.5 02/22/2022   PLT 539 (H) 02/22/2022   NEUTROABS 3.9 02/22/2022    ASSESSMENT & PLAN:  Essential thrombocytosis ([place]) Lab review: Platelet count 620 (was 577), WBC 7.9, hemoglobin 14.6 Plan: Currently on Hydrea 4 times a week.  (We will be decreasing Hydrea to 3 times a week)   Goal of treatment: To keep platelet count less than  600 Today's platelet count is 539 which is fairly stable Will decrease Hydrea to 3 times a week   Hydroxyurea toxicities: Inside of the lip feels to be rough and she is also very thirsty and seems to be drinking lots of water   continue with anticoagulation for history of recurrent blood clots.  Breast cancer of lower-inner quadrant of right female breast Stage I right breast cancer diagnosed in 1984: Status post mastectomy, adjuvant chemotherapy and adjuvant antiestrogen therapy.  Currently on surveillance   Breast Cancer Surveillance: 1. Breast exam 02/22/2021: Benign no palpable lumps or nodules 2. Mammogram left breast 08/22/21: Benign. Breast Density Category B.   Return to clinic in 1 year for follow-up    No orders of the defined types were placed in this encounter.  The patient has a good understanding of the overall plan. she agrees with it. she will call with any problems that may develop before the next visit here. Total time spent: 30 mins including face to face time and time spent for planning, charting and co-ordination of care   Harriette Ohara, MD 02/22/22    I Gardiner Coins am acting as a Education administrator for [employer]  I have reviewed the above documentation for accuracy and completeness, and I agree with the above.

## 2022-02-21 NOTE — Assessment & Plan Note (Signed)
Lab review: Platelet count 620 (was 577), WBC 7.9, hemoglobin 14.6 Plan: Currently on Hydrea 4 times a week.   Goal of treatment: To keep platelet count less than 600 Today's platelet count is 627 which is fairly stable compared to a year ago 620. We discussed the pros and cons of increasing the Hydrea dose and decided to keep with the same.   Hydroxyurea toxicities: Being monitored, no adverse effects   continue with anticoagulation for history of recurrent blood clots.

## 2022-02-21 NOTE — Assessment & Plan Note (Signed)
Stage I right breast cancer diagnosed in 1984: Status post mastectomy, adjuvant chemotherapy and adjuvant antiestrogen therapy.  Currently on surveillance   Breast Cancer Surveillance: 1. Breast exam 02/22/2021: Benign no palpable lumps or nodules 2. Mammogram left breast 08/22/21: Benign. Breast Density Category B.   Return to clinic in 1 year for follow-up

## 2022-02-22 ENCOUNTER — Inpatient Hospital Stay: Payer: Medicare Other | Attending: Hematology and Oncology

## 2022-02-22 ENCOUNTER — Inpatient Hospital Stay (HOSPITAL_BASED_OUTPATIENT_CLINIC_OR_DEPARTMENT_OTHER): Payer: Medicare Other | Admitting: Hematology and Oncology

## 2022-02-22 VITALS — BP 162/100 | HR 74 | Temp 97.5°F | Resp 18 | Ht 65.5 in | Wt 224.6 lb

## 2022-02-22 DIAGNOSIS — D473 Essential (hemorrhagic) thrombocythemia: Secondary | ICD-10-CM | POA: Diagnosis present

## 2022-02-22 DIAGNOSIS — Z9011 Acquired absence of right breast and nipple: Secondary | ICD-10-CM | POA: Diagnosis not present

## 2022-02-22 DIAGNOSIS — Z170 Estrogen receptor positive status [ER+]: Secondary | ICD-10-CM | POA: Diagnosis not present

## 2022-02-22 DIAGNOSIS — C50311 Malignant neoplasm of lower-inner quadrant of right female breast: Secondary | ICD-10-CM

## 2022-02-22 DIAGNOSIS — Z79899 Other long term (current) drug therapy: Secondary | ICD-10-CM | POA: Diagnosis not present

## 2022-02-22 DIAGNOSIS — Z7901 Long term (current) use of anticoagulants: Secondary | ICD-10-CM | POA: Diagnosis not present

## 2022-02-22 DIAGNOSIS — Z853 Personal history of malignant neoplasm of breast: Secondary | ICD-10-CM | POA: Diagnosis not present

## 2022-02-22 LAB — CMP (CANCER CENTER ONLY)
ALT: 15 U/L (ref 0–44)
AST: 22 U/L (ref 15–41)
Albumin: 4 g/dL (ref 3.5–5.0)
Alkaline Phosphatase: 50 U/L (ref 38–126)
Anion gap: 5 (ref 5–15)
BUN: 11 mg/dL (ref 8–23)
CO2: 29 mmol/L (ref 22–32)
Calcium: 9.4 mg/dL (ref 8.9–10.3)
Chloride: 106 mmol/L (ref 98–111)
Creatinine: 1.02 mg/dL — ABNORMAL HIGH (ref 0.44–1.00)
GFR, Estimated: 54 mL/min — ABNORMAL LOW (ref >60)
Glucose, Bld: 108 mg/dL — ABNORMAL HIGH (ref 70–99)
Potassium: 4 mmol/L (ref 3.5–5.1)
Sodium: 140 mmol/L (ref 135–145)
Total Bilirubin: 0.5 mg/dL (ref 0.3–1.2)
Total Protein: 6.9 g/dL (ref 6.5–8.1)

## 2022-02-22 LAB — CBC WITH DIFFERENTIAL (CANCER CENTER ONLY)
Abs Immature Granulocytes: 0.01 10*3/uL (ref 0.00–0.07)
Basophils Absolute: 0.1 10*3/uL (ref 0.0–0.1)
Basophils Relative: 1 %
Eosinophils Absolute: 0.2 10*3/uL (ref 0.0–0.5)
Eosinophils Relative: 3 %
HCT: 46.2 % — ABNORMAL HIGH (ref 36.0–46.0)
Hemoglobin: 15.4 g/dL — ABNORMAL HIGH (ref 12.0–15.0)
Immature Granulocytes: 0 %
Lymphocytes Relative: 32 %
Lymphs Abs: 2.2 10*3/uL (ref 0.7–4.0)
MCH: 29.8 pg (ref 26.0–34.0)
MCHC: 33.3 g/dL (ref 30.0–36.0)
MCV: 89.5 fL (ref 80.0–100.0)
Monocytes Absolute: 0.5 10*3/uL (ref 0.1–1.0)
Monocytes Relative: 8 %
Neutro Abs: 3.9 10*3/uL (ref 1.7–7.7)
Neutrophils Relative %: 56 %
Platelet Count: 539 10*3/uL — ABNORMAL HIGH (ref 150–400)
RBC: 5.16 MIL/uL — ABNORMAL HIGH (ref 3.87–5.11)
RDW: 16.7 % — ABNORMAL HIGH (ref 11.5–15.5)
WBC Count: 6.9 10*3/uL (ref 4.0–10.5)
nRBC: 0 % (ref 0.0–0.2)

## 2022-04-12 ENCOUNTER — Other Ambulatory Visit: Payer: Self-pay | Admitting: Hematology and Oncology

## 2022-05-10 NOTE — Progress Notes (Signed)
Patient Care Team: Deland Pretty, MD as PCP - General (Internal Medicine) Debara Pickett Nadean Corwin, MD as PCP - Cardiology (Cardiology) Nicholas Lose, MD as Consulting Physician (Hematology and Oncology)  DIAGNOSIS: No diagnosis found.  SUMMARY OF ONCOLOGIC HISTORY: Oncology History  Breast cancer of lower-inner quadrant of right female breast ([place])  09/23/1982 Surgery   Right mastectomy foll by reconstruction; Stage 1   10/24/1982 - 03/26/1983 Chemotherapy   Chemotherapy X 6 months   04/23/1983 - 10/23/1988 Anti-estrogen oral therapy   Tamoxifen 20 mg daily   Essential thrombocytosis ([place])  04/26/2010 Procedure   Bone marrow biopsy: Hypercellular with features of myeloproliferative neoplasm differential diagnosis ET vs P. vera   04/26/2010 -  Chemotherapy   Hydrea 500 mg twice daily; later changed to alternate once daily and twice daily (due to leg swelling); changed to once daily 10/24/13; decrease to 3 times a week on 02/22/2016     CHIEF COMPLIANT: Follow-up of essential thrombocytosis. Remote history of breast cancer   INTERVAL HISTORY: Lori Page is a 87 year old with above-mentioned history of breast cancer and thrombocytosis. Currently on Hydrea. She presents to the clinic for a follow-up.    ALLERGIES:  has No Known Allergies.  MEDICATIONS:  Current Outpatient Medications  Medication Sig Dispense Refill   hydroxyurea (HYDREA) 500 MG capsule TAKE 1 CAPSULE (500 MG TOTAL) BY MOUTH 4 (FOUR) TIMES A WEEK. MAY TAKE WITH FOOD TO MINIMIZE GI SIDE EFFECTS. 64 capsule 5   levothyroxine (SYNTHROID, LEVOTHROID) 25 MCG tablet Take 25 mcg by mouth daily before breakfast.     metoprolol succinate (TOPROL-XL) 100 MG 24 hr tablet Take 100 mg by mouth daily. Take with or immediately following a meal.     pravastatin (PRAVACHOL) 80 MG tablet Take 80 mg by mouth every evening.   1   rivaroxaban (XARELTO) 20 MG TABS tablet Take 20 mg by mouth daily with supper.      triamterene-hydrochlorothiazide (MAXZIDE-25) 37.5-25 MG tablet Take 1 tablet by mouth daily.     No current facility-administered medications for this visit.    PHYSICAL EXAMINATION: ECOG PERFORMANCE STATUS: {CHL ONC ECOG [phone]}  There were no vitals filed for this visit. There were no vitals filed for this visit.  BREAST:*** No palpable masses or nodules in either right or left breasts. No palpable axillary supraclavicular or infraclavicular adenopathy no breast tenderness or nipple discharge. (exam performed in the presence of a chaperone)  LABORATORY DATA:  I have reviewed the data as listed    Latest Ref Rng & Units 02/22/2022    9:01 AM 02/20/2018    1:46 PM 05/06/2016    3:58 AM  CMP  Glucose 70 - 99 mg/dL 108  121  111   BUN 8 - 23 mg/dL 11  19  10    Creatinine 0.44 - 1.00 mg/dL 1.02  1.18  0.94   Sodium 135 - 145 mmol/L 140  141  139   Potassium 3.5 - 5.1 mmol/L 4.0  3.6  3.7   Chloride 98 - 111 mmol/L 106  105  109   CO2 22 - 32 mmol/L 29  27  24    Calcium 8.9 - 10.3 mg/dL 9.4  9.6  8.1   Total Protein 6.5 - 8.1 g/dL 6.9  6.8    Total Bilirubin 0.3 - 1.2 mg/dL 0.5  0.4    Alkaline Phos 38 - 126 U/L 50  63    AST 15 - 41 U/L 22  21  ALT 0 - 44 U/L 15  16      Lab Results  Component Value Date   WBC 6.9 02/22/2022   HGB 15.4 (H) 02/22/2022   HCT 46.2 (H) 02/22/2022   MCV 89.5 02/22/2022   PLT 539 (H) 02/22/2022   NEUTROABS 3.9 02/22/2022    ASSESSMENT & PLAN:  No problem-specific Assessment & Plan notes found for this encounter.    No orders of the defined types were placed in this encounter.  The patient has a good understanding of the overall plan. she agrees with it. she will call with any problems that may develop before the next visit here. Total time spent: 30 mins including face to face time and time spent for planning, charting and co-ordination of care   Suzzette Righter, [place] 05/10/22    I Gardiner Coins am acting as a Education administrator for  [employer]  ***

## 2022-05-16 NOTE — Assessment & Plan Note (Signed)
Lab review: Platelet count 620 (was 577), WBC 7.9, hemoglobin 14.6 Plan: Currently on Hydrea 4 times a week.  (We will be decreasing Hydrea to 3 times a week)   Goal of treatment: To keep platelet count less than 600 Today's platelet count is 539 which is fairly stable Will decrease Hydrea to 3 times a week   Hydroxyurea toxicities: Inside of the lip feels to be rough and she is also very thirsty and seems to be drinking lots of water   continue with anticoagulation for history of recurrent blood clots.   Breast cancer of lower-inner quadrant of right female breast Stage I right breast cancer diagnosed in 1984: Status post mastectomy, adjuvant chemotherapy and adjuvant antiestrogen therapy.  Currently on surveillance   Breast Cancer Surveillance: 1. Breast exam 05/16/2022: Benign no palpable lumps or nodules 2. Mammogram left breast 08/22/21: Benign. Breast Density Category B.   Return to clinic in 1 year for follow-up

## 2022-05-17 ENCOUNTER — Inpatient Hospital Stay: Payer: Medicare Other | Attending: Hematology and Oncology

## 2022-05-17 ENCOUNTER — Inpatient Hospital Stay (HOSPITAL_BASED_OUTPATIENT_CLINIC_OR_DEPARTMENT_OTHER): Payer: Medicare Other | Admitting: Hematology and Oncology

## 2022-05-17 VITALS — BP 155/75 | HR 73 | Temp 97.3°F | Resp 18 | Ht 65.5 in | Wt 227.1 lb

## 2022-05-17 DIAGNOSIS — Z170 Estrogen receptor positive status [ER+]: Secondary | ICD-10-CM | POA: Diagnosis not present

## 2022-05-17 DIAGNOSIS — D473 Essential (hemorrhagic) thrombocythemia: Secondary | ICD-10-CM | POA: Diagnosis present

## 2022-05-17 DIAGNOSIS — C50311 Malignant neoplasm of lower-inner quadrant of right female breast: Secondary | ICD-10-CM

## 2022-05-17 DIAGNOSIS — Z79899 Other long term (current) drug therapy: Secondary | ICD-10-CM | POA: Diagnosis not present

## 2022-05-17 DIAGNOSIS — Z853 Personal history of malignant neoplasm of breast: Secondary | ICD-10-CM | POA: Diagnosis present

## 2022-05-17 DIAGNOSIS — Z9011 Acquired absence of right breast and nipple: Secondary | ICD-10-CM | POA: Diagnosis not present

## 2022-05-17 LAB — CBC WITH DIFFERENTIAL (CANCER CENTER ONLY)
Abs Immature Granulocytes: 0.01 10*3/uL (ref 0.00–0.07)
Basophils Absolute: 0.1 10*3/uL (ref 0.0–0.1)
Basophils Relative: 1 %
Eosinophils Absolute: 0.2 10*3/uL (ref 0.0–0.5)
Eosinophils Relative: 3 %
HCT: 45.3 % (ref 36.0–46.0)
Hemoglobin: 15.3 g/dL — ABNORMAL HIGH (ref 12.0–15.0)
Immature Granulocytes: 0 %
Lymphocytes Relative: 30 %
Lymphs Abs: 2.3 10*3/uL (ref 0.7–4.0)
MCH: 29.7 pg (ref 26.0–34.0)
MCHC: 33.8 g/dL (ref 30.0–36.0)
MCV: 87.8 fL (ref 80.0–100.0)
Monocytes Absolute: 0.6 10*3/uL (ref 0.1–1.0)
Monocytes Relative: 8 %
Neutro Abs: 4.4 10*3/uL (ref 1.7–7.7)
Neutrophils Relative %: 58 %
Platelet Count: 564 10*3/uL — ABNORMAL HIGH (ref 150–400)
RBC: 5.16 MIL/uL — ABNORMAL HIGH (ref 3.87–5.11)
RDW: 15.5 % (ref 11.5–15.5)
WBC Count: 7.5 10*3/uL (ref 4.0–10.5)
nRBC: 0 % (ref 0.0–0.2)

## 2022-07-12 ENCOUNTER — Other Ambulatory Visit: Payer: Self-pay | Admitting: Internal Medicine

## 2022-07-12 DIAGNOSIS — Z1231 Encounter for screening mammogram for malignant neoplasm of breast: Secondary | ICD-10-CM

## 2022-08-24 ENCOUNTER — Ambulatory Visit
Admission: RE | Admit: 2022-08-24 | Discharge: 2022-08-24 | Disposition: A | Discharge status: Home / Self Care | Payer: Medicare Other | Source: Ambulatory Visit | Attending: Internal Medicine | Admitting: Internal Medicine

## 2022-08-24 DIAGNOSIS — Z1231 Encounter for screening mammogram for malignant neoplasm of breast: Secondary | ICD-10-CM

## 2022-09-10 NOTE — Progress Notes (Signed)
Patient Care Team: Merri Brunette, MD as PCP - General (Internal Medicine) Rennis Golden Lisette Abu, MD as PCP - Cardiology (Cardiology) Serena Croissant, MD as Consulting Physician (Hematology and Oncology)  DIAGNOSIS:  Encounter Diagnoses  Name Primary?   Malignant neoplasm of lower-inner quadrant of right breast of female, estrogen receptor positive (HCC) Yes   Essential thrombocytosis (HCC)     SUMMARY OF ONCOLOGIC HISTORY: Oncology History  Breast cancer of lower-inner quadrant of right female breast ([facility])  09/23/1982 Surgery   Right mastectomy foll by reconstruction; Stage 1   10/24/1982 - 03/26/1983 Chemotherapy   Chemotherapy X 6 months   04/23/1983 - 10/23/1988 Anti-estrogen oral therapy   Tamoxifen 20 mg daily   Essential thrombocytosis (HCC)  04/26/2010 Procedure   Bone marrow biopsy: Hypercellular with features of myeloproliferative neoplasm differential diagnosis ET vs P. vera   04/26/2010 -  Chemotherapy   Hydrea 500 mg twice daily; later changed to alternate once daily and twice daily (due to leg swelling); changed to once daily 10/24/13; decrease to 3 times a week on 02/22/2016     CHIEF COMPLIANT: Follow-up of essential thrombocytosis.   INTERVAL HISTORY: Lori Page is a 87 year old with above-mentioned history of breast cancer and thrombocytosis. Currently on Hydrea. She presents to the clinic for a follow-up. Patient reports that she is tolerating the Hydrea. She still have the peeling of the lips. She drinks a lot of water and staying hydrated.   ALLERGIES:  is allergic to lisinopril and semaglutide.  MEDICATIONS:  Current Outpatient Medications  Medication Sig Dispense Refill   calcium carbonate (CALCIUM 600) 600 MG TABS tablet Take 600 mg by mouth.     hydroxyurea (HYDREA) 500 MG capsule Take 1 capsule (500 mg total) by mouth 3 (three) times a week. May take with food to minimize GI side effects.     levothyroxine (SYNTHROID, LEVOTHROID) 25 MCG tablet Take 25  mcg by mouth daily before breakfast.     metoprolol succinate (TOPROL-XL) 100 MG 24 hr tablet Take 100 mg by mouth daily. Take with or immediately following a meal.     pravastatin (PRAVACHOL) 80 MG tablet Take 80 mg by mouth every evening.   1   rivaroxaban (XARELTO) 20 MG TABS tablet Take 20 mg by mouth daily with supper.     SPIKEVAX injection      triamterene-hydrochlorothiazide (MAXZIDE-25) 37.5-25 MG tablet Take 1 tablet by mouth daily.     No current facility-administered medications for this visit.    PHYSICAL EXAMINATION: ECOG PERFORMANCE STATUS: 1 - Symptomatic but completely ambulatory  Vitals:   09/15/22 0923  BP: (!) 150/85  Pulse: 69  Resp: 18  Temp: (!) 97.3 F (36.3 C)  SpO2: 98%   Filed Weights   09/15/22 0923  Weight: 223 lb 14.4 oz (101.6 kg)      LABORATORY DATA:  I have reviewed the data as listed    Latest Ref Rng & Units 02/22/2022    9:01 AM 02/20/2018    1:46 PM 05/06/2016    3:58 AM  CMP  Glucose 70 - 99 mg/dL 657  846  962   BUN 8 - 23 mg/dL 11  19  10    Creatinine 0.44 - 1.00 mg/dL 9.52  8.41  3.24   Sodium 135 - 145 mmol/L 140  141  139   Potassium 3.5 - 5.1 mmol/L 4.0  3.6  3.7   Chloride 98 - 111 mmol/L 106  105  109   CO2 22 -  32 mmol/L 29  27  24    Calcium 8.9 - 10.3 mg/dL 9.4  9.6  8.1   Total Protein 6.5 - 8.1 g/dL 6.9  6.8    Total Bilirubin 0.3 - 1.2 mg/dL 0.5  0.4    Alkaline Phos 38 - 126 U/L 50  63    AST 15 - 41 U/L 22  21    ALT 0 - 44 U/L 15  16      Lab Results  Component Value Date   WBC 6.9 09/15/2022   HGB 15.4 (H) 09/15/2022   HCT 46.6 (H) 09/15/2022   MCV 85.7 09/15/2022   PLT 642 (H) 09/15/2022   NEUTROABS 4.2 09/15/2022    ASSESSMENT & PLAN:  Breast cancer of lower-inner quadrant of right female breast Lab review: Platelet count 620 (was 577), WBC 7.9, hemoglobin 14.6 Plan:   Hydrea   3 times a week   Goal of treatment: To keep platelet count less than 600 Today's platelet count is 564 which is fairly  stable Currently on hydroxyurea 3 times a week   Hydroxyurea toxicities: Peeling of the lips   continue with anticoagulation for history of recurrent blood clots.   Breast cancer of lower-inner quadrant of right female breast Stage I right breast cancer diagnosed in 1984: Status post mastectomy, adjuvant chemotherapy and adjuvant antiestrogen therapy.  Currently on surveillance   Breast Cancer Surveillance: 1. Breast exam 05/16/2022: Benign no palpable lumps or nodules 2. Mammogram left breast 08/25/2022: Benign. Breast Density Category B.   Return to clinic in 3 months with labs and follow-up      Orders Placed This Encounter  Procedures   CBC with Differential (Cancer Center Only)    Standing Status:   Future    Standing Expiration Date:   09/15/2023   The patient has a good understanding of the overall plan. she agrees with it. she will call with any problems that may develop before the next visit here. Total time spent: 30 mins including face to face time and time spent for planning, charting and co-ordination of care   Tamsen Meek, MD 09/15/22    I Janan Ridge am acting as a Neurosurgeon for [employer]  I have reviewed the above documentation for accuracy and completeness, and I agree with the above.

## 2022-09-15 ENCOUNTER — Other Ambulatory Visit: Payer: Self-pay

## 2022-09-15 ENCOUNTER — Inpatient Hospital Stay: Payer: Medicare Other | Attending: Hematology and Oncology

## 2022-09-15 ENCOUNTER — Inpatient Hospital Stay: Payer: Medicare Other | Admitting: Hematology and Oncology

## 2022-09-15 VITALS — BP 150/85 | HR 69 | Temp 97.3°F | Resp 18 | Ht 65.5 in | Wt 223.9 lb

## 2022-09-15 DIAGNOSIS — C50311 Malignant neoplasm of lower-inner quadrant of right female breast: Secondary | ICD-10-CM

## 2022-09-15 DIAGNOSIS — D473 Essential (hemorrhagic) thrombocythemia: Secondary | ICD-10-CM

## 2022-09-15 DIAGNOSIS — Z170 Estrogen receptor positive status [ER+]: Secondary | ICD-10-CM

## 2022-09-15 DIAGNOSIS — Z7901 Long term (current) use of anticoagulants: Secondary | ICD-10-CM | POA: Diagnosis not present

## 2022-09-15 DIAGNOSIS — Z79899 Other long term (current) drug therapy: Secondary | ICD-10-CM | POA: Insufficient documentation

## 2022-09-15 DIAGNOSIS — Z9011 Acquired absence of right breast and nipple: Secondary | ICD-10-CM | POA: Insufficient documentation

## 2022-09-15 DIAGNOSIS — Z853 Personal history of malignant neoplasm of breast: Secondary | ICD-10-CM | POA: Insufficient documentation

## 2022-09-15 LAB — CBC WITH DIFFERENTIAL (CANCER CENTER ONLY)
Abs Immature Granulocytes: 0.02 10*3/uL (ref 0.00–0.07)
Basophils Absolute: 0.1 10*3/uL (ref 0.0–0.1)
Basophils Relative: 1 %
Eosinophils Absolute: 0.2 10*3/uL (ref 0.0–0.5)
Eosinophils Relative: 3 %
HCT: 46.6 % — ABNORMAL HIGH (ref 36.0–46.0)
Hemoglobin: 15.4 g/dL — ABNORMAL HIGH (ref 12.0–15.0)
Immature Granulocytes: 0 %
Lymphocytes Relative: 28 %
Lymphs Abs: 1.9 10*3/uL (ref 0.7–4.0)
MCH: 28.3 pg (ref 26.0–34.0)
MCHC: 33 g/dL (ref 30.0–36.0)
MCV: 85.7 fL (ref 80.0–100.0)
Monocytes Absolute: 0.6 10*3/uL (ref 0.1–1.0)
Monocytes Relative: 8 %
Neutro Abs: 4.2 10*3/uL (ref 1.7–7.7)
Neutrophils Relative %: 60 %
Platelet Count: 642 10*3/uL — ABNORMAL HIGH (ref 150–400)
RBC: 5.44 MIL/uL — ABNORMAL HIGH (ref 3.87–5.11)
RDW: 16.2 % — ABNORMAL HIGH (ref 11.5–15.5)
WBC Count: 6.9 10*3/uL (ref 4.0–10.5)
nRBC: 0 % (ref 0.0–0.2)

## 2022-09-15 MED ORDER — HYDROXYUREA 500 MG PO CAPS: 500.0000 mg | ORAL_CAPSULE | ORAL | Status: DC

## 2022-09-15 NOTE — Assessment & Plan Note (Signed)
Lab review: Platelet count 620 (was 577), WBC 7.9, hemoglobin 14.6 Plan:   Hydrea   3 times a week   Goal of treatment: To keep platelet count less than 600 Today's platelet count is 564 which is fairly stable Currently on hydroxyurea 3 times a week   Hydroxyurea toxicities: Peeling of the lips   continue with anticoagulation for history of recurrent blood clots.   Breast cancer of lower-inner quadrant of right female breast Stage I right breast cancer diagnosed in 1984: Status post mastectomy, adjuvant chemotherapy and adjuvant antiestrogen therapy.  Currently on surveillance   Breast Cancer Surveillance: 1. Breast exam 05/16/2022: Benign no palpable lumps or nodules 2. Mammogram left breast 08/25/2022: Benign. Breast Density Category B.   Return to clinic in 4 months with labs and follow-up

## 2023-01-15 NOTE — Assessment & Plan Note (Signed)
Lab review: Platelet count 620 (was 577), WBC 7.9, hemoglobin 14.6 Plan:   Hydrea   3 times a week   Goal of treatment: To keep platelet count less than 600 Today's platelet count is 564 which is fairly stable Currently on hydroxyurea 3 times a week   Hydroxyurea toxicities: Peeling of the lips   continue with anticoagulation for history of recurrent blood clots.   Breast cancer of lower-inner quadrant of right female breast Stage I right breast cancer diagnosed in 1984: Status post mastectomy, adjuvant chemotherapy and adjuvant antiestrogen therapy.  Currently on surveillance   Breast Cancer Surveillance: 1. Breast exam 01/16/2023: Benign no palpable lumps or nodules 2. Mammogram left breast 08/25/2022: Benign. Breast Density Category B.   Return to clinic in 3 months with labs and follow-up

## 2023-01-16 ENCOUNTER — Inpatient Hospital Stay: Payer: Medicare Other | Attending: Hematology and Oncology

## 2023-01-16 ENCOUNTER — Inpatient Hospital Stay: Payer: Medicare Other | Admitting: Hematology and Oncology

## 2023-01-16 VITALS — BP 153/73 | HR 69 | Temp 97.5°F | Resp 18 | Ht 65.5 in | Wt 223.2 lb

## 2023-01-16 DIAGNOSIS — Z7964 Long term (current) use of myelosuppressive agent: Secondary | ICD-10-CM | POA: Insufficient documentation

## 2023-01-16 DIAGNOSIS — Z9011 Acquired absence of right breast and nipple: Secondary | ICD-10-CM | POA: Diagnosis not present

## 2023-01-16 DIAGNOSIS — D473 Essential (hemorrhagic) thrombocythemia: Secondary | ICD-10-CM

## 2023-01-16 DIAGNOSIS — Z170 Estrogen receptor positive status [ER+]: Secondary | ICD-10-CM

## 2023-01-16 DIAGNOSIS — Z853 Personal history of malignant neoplasm of breast: Secondary | ICD-10-CM | POA: Insufficient documentation

## 2023-01-16 DIAGNOSIS — C50311 Malignant neoplasm of lower-inner quadrant of right female breast: Secondary | ICD-10-CM

## 2023-01-16 DIAGNOSIS — Z79899 Other long term (current) drug therapy: Secondary | ICD-10-CM | POA: Diagnosis not present

## 2023-01-16 DIAGNOSIS — Z7901 Long term (current) use of anticoagulants: Secondary | ICD-10-CM | POA: Insufficient documentation

## 2023-01-16 DIAGNOSIS — D751 Secondary polycythemia: Secondary | ICD-10-CM | POA: Diagnosis not present

## 2023-01-16 LAB — CBC WITH DIFFERENTIAL (CANCER CENTER ONLY)
Abs Immature Granulocytes: 0.02 10*3/uL (ref 0.00–0.07)
Basophils Absolute: 0.1 10*3/uL (ref 0.0–0.1)
Basophils Relative: 1 %
Eosinophils Absolute: 0.2 10*3/uL (ref 0.0–0.5)
Eosinophils Relative: 3 %
HCT: 47.4 % — ABNORMAL HIGH (ref 36.0–46.0)
Hemoglobin: 15.2 g/dL — ABNORMAL HIGH (ref 12.0–15.0)
Immature Granulocytes: 0 %
Lymphocytes Relative: 27 %
Lymphs Abs: 2 10*3/uL (ref 0.7–4.0)
MCH: 28.3 pg (ref 26.0–34.0)
MCHC: 32.1 g/dL (ref 30.0–36.0)
MCV: 88.3 fL (ref 80.0–100.0)
Monocytes Absolute: 0.6 10*3/uL (ref 0.1–1.0)
Monocytes Relative: 8 %
Neutro Abs: 4.4 10*3/uL (ref 1.7–7.7)
Neutrophils Relative %: 61 %
Platelet Count: 595 10*3/uL — ABNORMAL HIGH (ref 150–400)
RBC: 5.37 MIL/uL — ABNORMAL HIGH (ref 3.87–5.11)
RDW: 17.6 % — ABNORMAL HIGH (ref 11.5–15.5)
WBC Count: 7.2 10*3/uL (ref 4.0–10.5)
nRBC: 0 % (ref 0.0–0.2)

## 2023-01-16 NOTE — Progress Notes (Signed)
Patient Care Team: Merri Brunette, MD as PCP - General (Internal Medicine) Rennis Golden Lisette Abu, MD as PCP - Cardiology (Cardiology) Serena Croissant, MD as Consulting Physician (Hematology and Oncology)  DIAGNOSIS:  Encounter Diagnoses  Name Primary?   Malignant neoplasm of lower-inner quadrant of right breast of female, estrogen receptor positive (HCC) Yes   Essential thrombocytosis ([facility])     SUMMARY OF ONCOLOGIC HISTORY: Oncology History  Breast cancer of lower-inner quadrant of right female breast ([facility])  09/23/1982 Surgery   Right mastectomy foll by reconstruction; Stage 1   10/24/1982 - 03/26/1983 Chemotherapy   Chemotherapy X 6 months   04/23/1983 - 10/23/1988 Anti-estrogen oral therapy   Tamoxifen 20 mg daily   Essential thrombocytosis ([facility])  04/26/2010 Procedure   Bone marrow biopsy: Hypercellular with features of myeloproliferative neoplasm differential diagnosis ET vs P. vera   04/26/2010 -  Chemotherapy   Hydrea 500 mg twice daily; later changed to alternate once daily and twice daily (due to leg swelling); changed to once daily 10/24/13; decrease to 3 times a week on 02/22/2016     CHIEF COMPLIANT: Follow-up on Hydrea  HISTORY OF PRESENT ILLNESS:  History of Present Illness   The patient, with a history of ET, has been on Hydrea three times a week for the past four months. She reports fluctuating levels of Platelets, with the most recent reading 595, down from 642 in August. The patient tolerates the medication well, with no impact on white cells or hemoglobin. She experiences a persistent lip feeling sensation and dry throat, which she manages by increasing water intake. She attributes these symptoms to the medication.  In addition to her primary condition, the patient recently contracted COVID-19, which she describes as a severe experience. She was prescribed Paxlovid, which she tolerated well and her insurance covered the cost.         ALLERGIES:  is allergic to  lisinopril and semaglutide.  MEDICATIONS:  Current Outpatient Medications  Medication Sig Dispense Refill   calcium carbonate (CALCIUM 600) 600 MG TABS tablet Take 600 mg by mouth.     hydroxyurea (HYDREA) 500 MG capsule Take 1 capsule (500 mg total) by mouth 3 (three) times a week. May take with food to minimize GI side effects.     levothyroxine (SYNTHROID, LEVOTHROID) 25 MCG tablet Take 25 mcg by mouth daily before breakfast.     metoprolol succinate (TOPROL-XL) 100 MG 24 hr tablet Take 100 mg by mouth daily. Take with or immediately following a meal.     pravastatin (PRAVACHOL) 80 MG tablet Take 80 mg by mouth every evening.   1   rivaroxaban (XARELTO) 20 MG TABS tablet Take 20 mg by mouth daily with supper.     SPIKEVAX injection      triamterene-hydrochlorothiazide (MAXZIDE-25) 37.5-25 MG tablet Take 1 tablet by mouth daily.     No current facility-administered medications for this visit.    PHYSICAL EXAMINATION: ECOG PERFORMANCE STATUS: 1 - Symptomatic but completely ambulatory  Vitals:   01/16/23 0951  BP: (!) 153/73  Pulse: 69  Resp: 18  Temp: (!) 97.5 F (36.4 C)  SpO2: 97%   Filed Weights   01/16/23 0951  Weight: 223 lb 4 oz (101.3 kg)      LABORATORY DATA:  I have reviewed the data as listed    Latest Ref Rng & Units 02/22/2022    9:01 AM 02/20/2018    1:46 PM 05/06/2016    3:58 AM  CMP  Glucose 70 -  99 mg/dL 235  573  220   BUN 8 - 23 mg/dL 11  19  10    Creatinine 0.44 - 1.00 mg/dL 2.54  2.70  6.23   Sodium 135 - 145 mmol/L 140  141  139   Potassium 3.5 - 5.1 mmol/L 4.0  3.6  3.7   Chloride 98 - 111 mmol/L 106  105  109   CO2 22 - 32 mmol/L 29  27  24    Calcium 8.9 - 10.3 mg/dL 9.4  9.6  8.1   Total Protein 6.5 - 8.1 g/dL 6.9  6.8    Total Bilirubin 0.3 - 1.2 mg/dL 0.5  0.4    Alkaline Phos 38 - 126 U/L 50  63    AST 15 - 41 U/L 22  21    ALT 0 - 44 U/L 15  16      Lab Results  Component Value Date   WBC 7.2 01/16/2023   HGB 15.2 (H) 01/16/2023    HCT 47.4 (H) 01/16/2023   MCV 88.3 01/16/2023   PLT 595 (H) 01/16/2023   NEUTROABS 4.4 01/16/2023    ASSESSMENT & PLAN:  Essential thrombocytosis Lab review: Platelet count 620 (was 577), WBC 7.9, hemoglobin 14.6 Plan:   Hydrea   3 times a week   Goal of treatment: To keep platelet count less than 600 Today's platelet count is 564 which is fairly stable Currently on hydroxyurea 3 times a week   Hydroxyurea toxicities: Peeling of the lips   continue with anticoagulation for history of recurrent blood clots.   Breast cancer of lower-inner quadrant of right female breast Stage I right breast cancer diagnosed in 1984: Status post mastectomy, adjuvant chemotherapy and adjuvant antiestrogen therapy.  Currently on surveillance   Breast Cancer Surveillance: 1. Breast exam 01/16/2023: Benign no palpable lumps or nodules 2. Mammogram left breast 08/25/2022: Benign. Breast Density Category B.      Polycythemia Patient on Hydrea three times a week with good response. Hematocrit has decreased from 642 to 595. No adverse effects on white cells or hemoglobin. Dryness of skin and throat likely due to medication. -Continue Hydrea at current dose. -Plan to follow up in six months with repeat blood work.  COVID-19 Patient had a severe infection in September and was treated with Paxlovid. -No further action required at this time.  Breast Cancer Screening Patient is up to date with mammograms, last one in July. -Continue annual mammograms.     Labs in 6 months and follow-up     Orders Placed This Encounter  Procedures   CBC with Differential (Cancer Center Only)    Standing Status:   Future    Standing Expiration Date:   01/16/2024   The patient has a good understanding of the overall plan. she agrees with it. she will call with any problems that may develop before the next visit here. Total time spent: 30 mins including face to face time and time spent for planning, charting and  co-ordination of care   Tamsen Meek, MD 01/16/23

## 2023-04-14 ENCOUNTER — Other Ambulatory Visit: Payer: Self-pay | Admitting: Hematology and Oncology

## 2023-04-18 ENCOUNTER — Ambulatory Visit: Payer: Medicare Other | Attending: Internal Medicine | Admitting: Internal Medicine

## 2023-04-18 VITALS — BP 130/80 | HR 65 | Ht 65.5 in | Wt 231.0 lb

## 2023-04-18 DIAGNOSIS — Z79899 Other long term (current) drug therapy: Secondary | ICD-10-CM | POA: Diagnosis not present

## 2023-04-18 DIAGNOSIS — I4510 Unspecified right bundle-branch block: Secondary | ICD-10-CM

## 2023-04-18 DIAGNOSIS — I5032 Chronic diastolic (congestive) heart failure: Secondary | ICD-10-CM | POA: Diagnosis not present

## 2023-04-18 DIAGNOSIS — I10 Essential (primary) hypertension: Secondary | ICD-10-CM

## 2023-04-18 DIAGNOSIS — Z86718 Personal history of other venous thrombosis and embolism: Secondary | ICD-10-CM

## 2023-04-18 MED ORDER — VALSARTAN-HYDROCHLOROTHIAZIDE 80-12.5 MG PO TABS: 1.0000 | ORAL_TABLET | Freq: Every day | ORAL | 3 refills | Status: AC

## 2023-04-18 NOTE — Progress Notes (Signed)
 OFFICE NOTE  Chief Complaint:  Routine follow-up  Primary Care Physician: Merri Brunette, MD  HPI:  Britteney Ayotte is a pleasant 88 year old female with past medical history significant for breast cancer, ET, and prior DVT on Xarelto. Recently she's been describing some increasing shortness of breath with exertion. An EKG performed at her primary care provider's office demonstrates a right bundle branch block and inferior T-wave abnormalities concerning for ischemia. There is a history of heart disease in her family including her mother who died suddenly of a massive MI at age 34 and a brother with a stroke and died PVCs as well as heart disease. She denies any chest pain. She does take Lasix however only twice weekly at most if needed. She occasionally gets some swelling and mostly in her left leg which could be related to post-thrombotic syndrome.  Mrs. Jenison returns today for follow-up. She reports her shortness of breath is not necessarily getting worse. She denies any chest pain. She underwent a nuclear stress test which was negative for ischemia. EF is preserved. She also had an echocardiogram which showed normal systolic function with mild diastolic dysfunction and mild pulmonary hypertension.  02/23/2015  I saw Mrs. Reger back in the office today for follow-up. Overall she is currently doing well. A few months ago she was hospitalized for what sounds like sepsis, including fever and dehydration as well as a syncopal episode. The etiology of this was never determined. She recovered fairly quickly and has had no further single episodes. She seems to be doing well on Xarelto without any bleeding complications. She had an echocardiogram during that admission which was essentially unchanged from her prior echo. She has known mild mitral regurgitation and an audible murmur. She denies any chest pain or worsening shortness of breath. Unfortunately her weight is about the same as it was last  year.  03/18/2018  Mrs. Carberry is seen today in follow-up.  She is actually considered a new patient today since it is greater than 3 years from her last appointment.  She is done well over the last couple years.  She is followed by Dr. Pamelia Hoit and saw just recently.  She remains on Xarelto without bleeding problems as well as Hydrea.  She had recent lab work including lipid profile which showed LDL 61 total cholesterol 138.  Weight has been stable and is higher than ideal.  Blood pressure is elevated today however did come down to 120/92.  Her blood pressure recently was 140 over 70s in the office.  She has a stable right bundle branch block.  There is a history of pulmonary hypertension which was mild however she denies any worsening shortness of breath with exertion.  03/09/2020  Mrs. Ramaswamy returns today for 2-year follow-up.  She denies any chest pain or worsening shortness of breath.  She continues to see Dr. Pamelia Hoit and recently had an increase in her Hydrea.  She denies any bleeding on Xarelto.  She has had no further clotting issues.  Blood pressure appears to be well controlled at home although diastolic was a little high today.  She denies any worsening shortness of breath.  She has a stable right bundle branch block.  04/18/2023  Mrs. Shroff is seen today in follow-up.  I last saw her a couple years ago.  She is followed by Dr. Renne Crigler.  She is seen by Dr. Pamelia Hoit and has been on Hydrea.  She continues on Xarelto with a history of DVT.  EKG is unchanged.  Blood pressure is okay today however she reports she has to urinate more frequently.  She is little concerned about her combination diuretic.  Renal functions been normal.  Lipids are well-controlled with cholesterol and January total 154, HDL 66, triglycerides 80 and LDL 73.  PMHx:  Past Medical History:  Diagnosis Date   Breast cancer, right breast (HCC)    DVT (deep venous thrombosis) (HCC)    LLE   DVT, lower extremity, recurrent  (HCC)    Essential thrombocytosis (HCC) 06/08/2011   Headache    "a few/month" (05/04/2016)   Hypertension    Hypothyroidism     Past Surgical History:  Procedure Laterality Date   BREAST BIOPSY Right ~ 1985   CATARACT EXTRACTION W/ INTRAOCULAR LENS  IMPLANT, BILATERAL Bilateral 2000s   DILATION AND CURETTAGE OF UTERUS     MASTECTOMY Right ~ 1985   REDUCTION MAMMAPLASTY Left    VAGINAL HYSTERECTOMY      FAMHx:  Family History  Problem Relation Age of Onset   Heart disease Mother    Heart attack Mother    Cancer Father    Stroke Brother    Heart disease Brother    Diabetes Brother     SOCHx:   reports that she has never smoked. She has never used smokeless tobacco. She reports that she does not drink alcohol and does not use drugs.  ALLERGIES:  Allergies  Allergen Reactions   Lisinopril Cough   Semaglutide Other (See Comments)    ROS: Pertinent items noted in HPI and remainder of comprehensive ROS otherwise negative.  HOME MEDS: Current Outpatient Medications  Medication Sig Dispense Refill   calcium carbonate (CALCIUM 600) 600 MG TABS tablet Take 1,200 mg by mouth daily.     hydroxyurea (HYDREA) 500 MG capsule TAKE 1 CAPSULE (500 MG TOTAL) BY MOUTH 4 (FOUR) TIMES A WEEK. MAY TAKE WITH FOOD TO MINIMIZE GI SIDE EFFECTS. 48 capsule 7   levothyroxine (SYNTHROID, LEVOTHROID) 25 MCG tablet Take 25 mcg by mouth daily before breakfast.     metoprolol succinate (TOPROL-XL) 100 MG 24 hr tablet Take 100 mg by mouth daily. Take with or immediately following a meal.     pravastatin (PRAVACHOL) 80 MG tablet Take 80 mg by mouth every evening.   1   rivaroxaban (XARELTO) 20 MG TABS tablet Take 20 mg by mouth daily with supper.     SPIKEVAX injection      triamterene-hydrochlorothiazide (MAXZIDE-25) 37.5-25 MG tablet Take 1 tablet by mouth daily.     No current facility-administered medications for this visit.    LABS/IMAGING: No results found for this or any previous visit  (from the past 48 hours). No results found.  VITALS: BP 130/80 (BP Location: Left Arm, Patient Position: Sitting, Cuff Size: Normal)   Pulse 65   Ht 5' 5.5" (1.664 m)   Wt 231 lb (104.8 kg)   BMI 37.86 kg/m   EXAM: General appearance: alert, no distress and moderately obese Neck: no carotid bruit and no JVD Lungs: clear to auscultation bilaterally Heart: regular rate and rhythm, S1, S2 normal, no murmur, click, rub or gallop Abdomen: soft, non-tender; bowel sounds normal; no masses,  no organomegaly Extremities: extremities normal, atraumatic, no cyanosis or edema Pulses: 2+ and symmetric Skin: Skin color, texture, turgor normal. No rashes or lesions Neurologic: Grossly normal Psych: Pleasant  EKG: EKG Interpretation Date/Time:  Wednesday April 18 2023 10:29:52 EST Ventricular Rate:  65 PR Interval:  172 QRS Duration:  126 QT  Interval:  428 QTC Calculation: 445 R Axis:   38  Text Interpretation: Normal sinus rhythm Right bundle branch block T wave abnormality, consider inferior ischemia When compared with ECG of 09-Nov-2014 10:23, No significant change since last tracing Confirmed by Zoila Shutter [phone]) on 04/18/2023 10:52:22 AM    ASSESSMENT: Hypertension-controlled RBBB History of PE on Xarelto Dyslipidemia Mild pulmonary hypertension  PVC's - asymptomatic  PLAN: 1.   Ms Uber has noted more frequent issues with urination.  She does have some edema as well.  Some of this is due to chronic venous insufficiency.  I have encouraged wearing bilateral knee-high compression stockings which she has at home but does not use all the time.  In order to help with her frequent urination, would advise stopping her Maxide and will change her to Diovan HCTZ 80/12.5 mg daily.  Continue metoprolol XL 50 mg daily.  Advised her to monitor blood pressure at home over the next week and report to [place].  In addition we will repeat a metabolic profile which she could have drawn with her CBC  in early April.  Continue Xarelto given history of PE.  Follow-up otherwise with our APP in 1 year.  Chrystie Nose, MD, [facility], FACP  [place]  [facility]  Medical Director of the [facility] Lipid [facility] &  Cardiovascular Risk Reduction Clinic Diplomate of the American Board of Clinical Lipidology Attending Cardiologist  Direct Dial: [phone]  Fax: [phone]  Website:  [email or web address] Nicely Katheen Aslin 04/18/2023, 10:52 AM

## 2023-04-18 NOTE — Patient Instructions (Signed)
 Medication Instructions:  STOP triamterene-hydrochlorothiazide (maxzide)  START valsartan-hydrochlorothiazide 80/12.5mg  once daily  *If you need a refill on your cardiac medications before your next appointment, please call your pharmacy*   Lab Work: BMET with lab work planned on 05/17/23  If you have labs (blood work) drawn today and your tests are completely normal, you will receive your results only by: MyChart Message (if you have MyChart) OR A paper copy in the mail If you have any lab test that is abnormal or we need to change your treatment, we will call you to review the results.   Follow-Up: At [facility], you and your health needs are our priority.  As part of our continuing mission to provide you with exceptional heart care, we have created designated Provider Care Teams.  These Care Teams include your primary Cardiologist (physician) and Advanced Practice Providers (APPs -  Physician Assistants and Nurse Practitioners) who all work together to provide you with the care you need, when you need it.  We recommend signing up for the patient portal called "MyChart".  Sign up information is provided on this After Visit Summary.  MyChart is used to connect with patients for Virtual Visits (Telemedicine).  Patients are able to view lab/test results, encounter notes, upcoming appointments, etc.  Non-urgent messages can be sent to your provider as well.   To learn more about what you can do with MyChart, go to [email or web address].    Your next appointment:   12 months with PA or NP  Other Instructions   1st Floor: - Lobby - Registration  - Pharmacy  - Lab - Cafe  2nd Floor: - PV Lab - Diagnostic Testing (echo, CT, nuclear med)  3rd Floor: - Vacant  4th Floor: - TCTS (cardiothoracic surgery) - AFib Clinic - Structural Heart Clinic - Vascular Surgery  - Vascular Ultrasound  5th Floor: - HeartCare Cardiology (general and EP) - Clinical Pharmacy for  coumadin, hypertension, lipid, weight-loss medications, and med management appointments    Valet parking services will be available as well.

## 2023-05-03 LAB — BASIC METABOLIC PANEL
BUN/Creatinine Ratio: 12 (ref 12–28)
BUN: 10 mg/dL (ref 8–27)
CO2: 25 mmol/L (ref 20–29)
Calcium: 9.4 mg/dL (ref 8.7–10.3)
Chloride: 103 mmol/L (ref 96–106)
Creatinine, Ser: 0.81 mg/dL (ref 0.57–1.00)
Glucose: 108 mg/dL — ABNORMAL HIGH (ref 70–99)
Potassium: 3.8 mmol/L (ref 3.5–5.2)
Sodium: 143 mmol/L (ref 134–144)
eGFR: 71 mL/min/{1.73_m2} (ref >59)

## 2023-05-15 ENCOUNTER — Encounter: Payer: Self-pay | Admitting: Internal Medicine

## 2023-05-16 NOTE — Assessment & Plan Note (Signed)
 Stage I right breast cancer diagnosed in 1984: Status post mastectomy, adjuvant chemotherapy and adjuvant antiestrogen therapy.  Currently on surveillance   Breast Cancer Surveillance: 1. Breast exam 01/16/2023: Benign no palpable lumps or nodules 2. Mammogram left breast 08/25/2022: Benign. Breast Density Category B.

## 2023-05-16 NOTE — Assessment & Plan Note (Signed)
 Lab review: Platelet count 620 (was 577), WBC 7.9, hemoglobin 14.6 Plan:   Hydrea   3 times a week   Goal of treatment: To keep platelet count less than 600 Today's platelet count is 564 which is fairly stable Currently on hydroxyurea 3 times a week   Hydroxyurea toxicities: Peeling of the lips   continue with anticoagulation for history of recurrent blood clots.

## 2023-05-17 ENCOUNTER — Inpatient Hospital Stay: Payer: Medicare Other | Attending: Hematology and Oncology

## 2023-05-17 ENCOUNTER — Inpatient Hospital Stay: Payer: Medicare Other | Admitting: Hematology and Oncology

## 2023-05-17 VITALS — BP 163/92 | HR 63 | Temp 98.2°F | Resp 18 | Ht 65.5 in | Wt 223.9 lb

## 2023-05-17 DIAGNOSIS — Z170 Estrogen receptor positive status [ER+]: Secondary | ICD-10-CM

## 2023-05-17 DIAGNOSIS — D473 Essential (hemorrhagic) thrombocythemia: Secondary | ICD-10-CM | POA: Insufficient documentation

## 2023-05-17 DIAGNOSIS — Z9011 Acquired absence of right breast and nipple: Secondary | ICD-10-CM | POA: Diagnosis not present

## 2023-05-17 DIAGNOSIS — Z853 Personal history of malignant neoplasm of breast: Secondary | ICD-10-CM | POA: Insufficient documentation

## 2023-05-17 DIAGNOSIS — C50311 Malignant neoplasm of lower-inner quadrant of right female breast: Secondary | ICD-10-CM | POA: Diagnosis not present

## 2023-05-17 DIAGNOSIS — Z8616 Personal history of COVID-19: Secondary | ICD-10-CM | POA: Insufficient documentation

## 2023-05-17 LAB — CBC WITH DIFFERENTIAL (CANCER CENTER ONLY)
Abs Immature Granulocytes: 0.02 10*3/uL (ref 0.00–0.07)
Basophils Absolute: 0.1 10*3/uL (ref 0.0–0.1)
Basophils Relative: 1 %
Eosinophils Absolute: 0.2 10*3/uL (ref 0.0–0.5)
Eosinophils Relative: 2 %
HCT: 46.8 % — ABNORMAL HIGH (ref 36.0–46.0)
Hemoglobin: 15.2 g/dL — ABNORMAL HIGH (ref 12.0–15.0)
Immature Granulocytes: 0 %
Lymphocytes Relative: 24 %
Lymphs Abs: 1.8 10*3/uL (ref 0.7–4.0)
MCH: 28.3 pg (ref 26.0–34.0)
MCHC: 32.5 g/dL (ref 30.0–36.0)
MCV: 87 fL (ref 80.0–100.0)
Monocytes Absolute: 0.6 10*3/uL (ref 0.1–1.0)
Monocytes Relative: 8 %
Neutro Abs: 4.8 10*3/uL (ref 1.7–7.7)
Neutrophils Relative %: 65 %
Platelet Count: 496 10*3/uL — ABNORMAL HIGH (ref 150–400)
RBC: 5.38 MIL/uL — ABNORMAL HIGH (ref 3.87–5.11)
RDW: 16.6 % — ABNORMAL HIGH (ref 11.5–15.5)
WBC Count: 7.4 10*3/uL (ref 4.0–10.5)
nRBC: 0 % (ref 0.0–0.2)

## 2023-05-17 NOTE — Progress Notes (Signed)
 Patient Care Team: Merri Brunette, MD as PCP - General (Internal Medicine) Rennis Golden Lisette Abu, MD as PCP - Cardiology (Cardiology) Serena Croissant, MD as Consulting Physician (Hematology and Oncology)  DIAGNOSIS:  Encounter Diagnoses  Name Primary?   Malignant neoplasm of lower-inner quadrant of right breast of female, estrogen receptor positive (HCC) Yes   Essential thrombocytosis (HCC)     SUMMARY OF ONCOLOGIC HISTORY: Oncology History  Breast cancer of lower-inner quadrant of right female breast (HCC)  09/23/1982 Surgery   Right mastectomy foll by reconstruction; Stage 1   10/24/1982 - 03/26/1983 Chemotherapy   Chemotherapy X 6 months   04/23/1983 - 10/23/1988 Anti-estrogen oral therapy   Tamoxifen 20 mg daily   Essential thrombocytosis (HCC)  04/26/2010 Procedure   Bone marrow biopsy: Hypercellular with features of myeloproliferative neoplasm differential diagnosis ET vs P. vera   04/26/2010 -  Chemotherapy   Hydrea 500 mg twice daily; later changed to alternate once daily and twice daily (due to leg swelling); changed to once daily 10/24/13; decrease to 3 times a week on 02/22/2016     CHIEF COMPLIANT: Follow-up of essential thrombocytosis and history of breast cancer  HISTORY OF PRESENT ILLNESS:   History of Present Illness The patient, with a history of COVID-19 infection, presents for a follow-up visit. She reports residual congestion a month after her recovery from COVID-19. She also mentions receiving both the COVID-19 and flu vaccines.  The patient has been dealing with dehydration, which has been managed by a change in medication by her primary care physician, Dr. Rennis Golden. She also reports improved sleep since the medication change.  The patient is on hydroxyurea three times a week for an unspecified condition. She has noticed a significant improvement in her blood work, with her platelet count dropping below 500 for the first time.  The patient also mentions a friend who  recently lost weight and was diagnosed with pancreatic cancer, which has caused some concern. She has a history of breast cancer, which she has been managing for about 43 years.  Lastly, the patient mentions a family member who had a heart attack but has since recovered and returned to work.     ALLERGIES:  is allergic to lisinopril and semaglutide.  MEDICATIONS:  Current Outpatient Medications  Medication Sig Dispense Refill   calcium carbonate (CALCIUM 600) 600 MG TABS tablet Take 1,200 mg by mouth daily.     hydroxyurea (HYDREA) 500 MG capsule TAKE 1 CAPSULE (500 MG TOTAL) BY MOUTH 4 (FOUR) TIMES A WEEK. MAY TAKE WITH FOOD TO MINIMIZE GI SIDE EFFECTS. 48 capsule 7   levothyroxine (SYNTHROID, LEVOTHROID) 25 MCG tablet Take 25 mcg by mouth daily before breakfast.     metoprolol succinate (TOPROL-XL) 100 MG 24 hr tablet Take 50 mg by mouth daily. Take with or immediately following a meal.     pravastatin (PRAVACHOL) 80 MG tablet Take 80 mg by mouth every evening.   1   rivaroxaban (XARELTO) 20 MG TABS tablet Take 20 mg by mouth daily with supper.     SPIKEVAX injection      valsartan-hydrochlorothiazide (DIOVAN HCT) 80-12.5 MG tablet Take 1 tablet by mouth daily. 90 tablet 3   No current facility-administered medications for this visit.    PHYSICAL EXAMINATION: ECOG PERFORMANCE STATUS: 1 - Symptomatic but completely ambulatory  Vitals:   05/17/23 0928 05/17/23 0930  BP: (!) 178/88 (!) 163/92  Pulse: 63   Resp: 18   Temp: 98.2 F (36.8 C)  SpO2: 98%    Filed Weights   05/17/23 0928  Weight: 223 lb 14.4 oz (101.6 kg)    Physical Exam   (exam performed in the presence of a chaperone)  LABORATORY DATA:  I have reviewed the data as listed    Latest Ref Rng & Units 05/02/2023    4:24 PM 02/22/2022    9:01 AM 02/20/2018    1:46 PM  CMP  Glucose 70 - 99 mg/dL 161  096  045   BUN 8 - 27 mg/dL 10  11  19    Creatinine 0.57 - 1.00 mg/dL 4.09  8.11  9.14   Sodium 134 - 144  mmol/L 143  140  141   Potassium 3.5 - 5.2 mmol/L 3.8  4.0  3.6   Chloride 96 - 106 mmol/L 103  106  105   CO2 20 - 29 mmol/L 25  29  27    Calcium 8.7 - 10.3 mg/dL 9.4  9.4  9.6   Total Protein 6.5 - 8.1 g/dL  6.9  6.8   Total Bilirubin 0.3 - 1.2 mg/dL  0.5  0.4   Alkaline Phos 38 - 126 U/L  50  63   AST 15 - 41 U/L  22  21   ALT 0 - 44 U/L  15  16     Lab Results  Component Value Date   WBC 7.4 05/17/2023   HGB 15.2 (H) 05/17/2023   HCT 46.8 (H) 05/17/2023   MCV 87.0 05/17/2023   PLT 496 (H) 05/17/2023   NEUTROABS 4.8 05/17/2023    ASSESSMENT & PLAN:  Breast cancer of lower-inner quadrant of right female breast Stage I right breast cancer diagnosed in 1984: Status post mastectomy, adjuvant chemotherapy and adjuvant antiestrogen therapy.  Currently on surveillance   Breast Cancer Surveillance: 1. Breast exam 01/16/2023: Benign no palpable lumps or nodules 2. Mammogram left breast 08/25/2022: Benign. Breast Density Category B.   Patient had COVID and influenza last month.  Essential thrombocytosis (HCC) Lab review: Platelet count 620 (was 577), WBC 7.9, hemoglobin 14.6 Plan:   Hydrea 3 times a week   Goal of treatment: To keep platelet count less than 600 Today's platelet count is 496 which is fairly stable/improving Currently on hydroxyurea 3 times a week   Hydroxyurea toxicities: Peeling of the lips   continue with anticoagulation for history of recurrent blood clots. Labs in 6 months and follow-up ------------------------------------- Assessment and Plan Assessment & Plan Essential thrombocytosis Platelet count improved to 496. Hydroxyurea effective. Consider dosage reduction if count drops to 200s. Improvement allows for extended follow-up. - Continue hydroxyurea 500 mg orally three times a week. - Extend follow-up blood work to every six months.  Malignant neoplasm of lower-inner quadrant of right breast, estrogen receptor positive Breast cancer under regular  surveillance. Emphasized importance of treatment and monitoring for quality of life. - Continue regular mammogram screenings.      No orders of the defined types were placed in this encounter.  The patient has a good understanding of the overall plan. she agrees with it. she will call with any problems that may develop before the next visit here. Total time spent: 30 mins including face to face time and time spent for planning, charting and co-ordination of care   Tamsen Meek, MD 05/17/23

## 2023-07-17 ENCOUNTER — Other Ambulatory Visit: Payer: Medicare Other

## 2023-07-17 ENCOUNTER — Ambulatory Visit: Payer: Medicare Other | Admitting: Hematology and Oncology

## 2023-07-23 ENCOUNTER — Other Ambulatory Visit: Payer: Self-pay | Admitting: Internal Medicine

## 2023-07-23 DIAGNOSIS — Z1231 Encounter for screening mammogram for malignant neoplasm of breast: Secondary | ICD-10-CM

## 2023-08-27 ENCOUNTER — Ambulatory Visit
Admission: RE | Admit: 2023-08-27 | Discharge: 2023-08-27 | Disposition: A | Discharge status: Home / Self Care | Source: Ambulatory Visit | Attending: Internal Medicine | Admitting: Internal Medicine

## 2023-08-27 ENCOUNTER — Ambulatory Visit

## 2023-08-27 DIAGNOSIS — Z1231 Encounter for screening mammogram for malignant neoplasm of breast: Secondary | ICD-10-CM

## 2023-11-14 ENCOUNTER — Inpatient Hospital Stay: Attending: Hematology and Oncology

## 2023-11-14 ENCOUNTER — Inpatient Hospital Stay: Admitting: Hematology and Oncology

## 2023-11-14 VITALS — BP 172/80 | HR 64 | Temp 97.5°F | Resp 18 | Ht 65.5 in | Wt 222.6 lb

## 2023-11-14 DIAGNOSIS — C50311 Malignant neoplasm of lower-inner quadrant of right female breast: Secondary | ICD-10-CM | POA: Diagnosis not present

## 2023-11-14 DIAGNOSIS — D473 Essential (hemorrhagic) thrombocythemia: Secondary | ICD-10-CM | POA: Diagnosis present

## 2023-11-14 DIAGNOSIS — Z7901 Long term (current) use of anticoagulants: Secondary | ICD-10-CM | POA: Diagnosis not present

## 2023-11-14 DIAGNOSIS — Z8616 Personal history of COVID-19: Secondary | ICD-10-CM | POA: Diagnosis not present

## 2023-11-14 DIAGNOSIS — Z9011 Acquired absence of right breast and nipple: Secondary | ICD-10-CM | POA: Insufficient documentation

## 2023-11-14 DIAGNOSIS — Z853 Personal history of malignant neoplasm of breast: Secondary | ICD-10-CM | POA: Insufficient documentation

## 2023-11-14 DIAGNOSIS — Z170 Estrogen receptor positive status [ER+]: Secondary | ICD-10-CM | POA: Diagnosis not present

## 2023-11-14 DIAGNOSIS — Z7964 Long term (current) use of myelosuppressive agent: Secondary | ICD-10-CM | POA: Insufficient documentation

## 2023-11-14 LAB — CBC WITH DIFFERENTIAL (CANCER CENTER ONLY)
Abs Immature Granulocytes: 0.02 K/uL (ref 0.00–0.07)
Basophils Absolute: 0.1 K/uL (ref 0.0–0.1)
Basophils Relative: 1 %
Eosinophils Absolute: 0.2 K/uL (ref 0.0–0.5)
Eosinophils Relative: 2 %
HCT: 45.8 % (ref 36.0–46.0)
Hemoglobin: 14.9 g/dL (ref 12.0–15.0)
Immature Granulocytes: 0 %
Lymphocytes Relative: 28 %
Lymphs Abs: 1.9 K/uL (ref 0.7–4.0)
MCH: 28 pg (ref 26.0–34.0)
MCHC: 32.5 g/dL (ref 30.0–36.0)
MCV: 86.1 fL (ref 80.0–100.0)
Monocytes Absolute: 0.6 K/uL (ref 0.1–1.0)
Monocytes Relative: 9 %
Neutro Abs: 4 K/uL (ref 1.7–7.7)
Neutrophils Relative %: 60 %
Platelet Count: 613 K/uL — ABNORMAL HIGH (ref 150–400)
RBC: 5.32 MIL/uL — ABNORMAL HIGH (ref 3.87–5.11)
RDW: 16.9 % — ABNORMAL HIGH (ref 11.5–15.5)
WBC Count: 6.7 K/uL (ref 4.0–10.5)
nRBC: 0 % (ref 0.0–0.2)

## 2023-11-14 NOTE — Assessment & Plan Note (Signed)
 Stage I right breast cancer diagnosed in 1984: Status post mastectomy, adjuvant chemotherapy and adjuvant antiestrogen therapy.  Currently on surveillance   Breast Cancer Surveillance: 1. Breast exam 01/16/2023: Benign no palpable lumps or nodules 2. Mammogram left breast 08/25/2022: Benign. Breast Density Category B.

## 2023-11-14 NOTE — Assessment & Plan Note (Signed)
 Lab review:  02/22/2021: Platelets 627 05/17/2023: Hemoglobin 15.2, platelets 496 Plan:   Hydrea  3 times a week   Goal of treatment: To keep platelet count less than 600 Currently on hydroxyurea  3 times a week   Hydroxyurea  toxicities: Peeling of the lips   continue with anticoagulation for history of recurrent blood clots. Labs in 6 months and follow-up

## 2023-11-14 NOTE — Progress Notes (Signed)
 Patient Care Team: Clarice Nottingham, MD as PCP - General (Internal Medicine) Mona Vinie BROCKS, MD as PCP - Cardiology (Cardiology) Odean Potts, MD as Consulting Physician (Hematology and Oncology)  DIAGNOSIS:  Encounter Diagnoses  Name Primary?   Malignant neoplasm of lower-inner quadrant of right breast of female, estrogen receptor positive (HCC) Yes   Essential thrombocytosis (HCC)     SUMMARY OF ONCOLOGIC HISTORY: Oncology History  Breast cancer of lower-inner quadrant of right female breast (HCC)  09/23/1982 Surgery   Right mastectomy foll by reconstruction; Stage 1   10/24/1982 - 03/26/1983 Chemotherapy   Chemotherapy X 6 months   04/23/1983 - 10/23/1988 Anti-estrogen oral therapy   Tamoxifen 20 mg daily   Essential thrombocytosis (HCC)  04/26/2010 Procedure   Bone marrow biopsy: Hypercellular with features of myeloproliferative neoplasm differential diagnosis ET vs P. vera   04/26/2010 -  Chemotherapy   Hydrea  500 mg twice daily; later changed to alternate once daily and twice daily (due to leg swelling); changed to once daily 10/24/13; decrease to 3 times a week on 02/22/2016     CHIEF COMPLIANT: Follow-up on hydroxyurea   HISTORY OF PRESENT ILLNESS:  History of Present Illness Lori Page is an 88 year old female with a history of blood clots who presents for follow-up of her blood disorder.  She is on Hydroxyurea  three times a week to manage her platelet count, which fluctuates between 500 and 640, with the most recent count at 613. She may have missed doses during a recent trip. She takes Rivaroxaban  due to previous blood clots, following unstable INR levels on prior therapy. Her insurance change has affected medication costs, but she has arranged a payment plan.     ALLERGIES:  is allergic to lisinopril and semaglutide.  MEDICATIONS:  Current Outpatient Medications  Medication Sig Dispense Refill   calcium  carbonate (CALCIUM  600) 600 MG TABS tablet Take  1,200 mg by mouth daily.     hydroxyurea  (HYDREA ) 500 MG capsule TAKE 1 CAPSULE (500 MG TOTAL) BY MOUTH 4 (FOUR) TIMES A WEEK. MAY TAKE WITH FOOD TO MINIMIZE GI SIDE EFFECTS. 48 capsule 7   levothyroxine (SYNTHROID, LEVOTHROID) 25 MCG tablet Take 25 mcg by mouth daily before breakfast.     metoprolol  succinate (TOPROL -XL) 100 MG 24 hr tablet Take 50 mg by mouth daily. Take with or immediately following a meal.     pravastatin  (PRAVACHOL ) 80 MG tablet Take 80 mg by mouth every evening.   1   rivaroxaban  (XARELTO ) 20 MG TABS tablet Take 20 mg by mouth daily with supper.     valsartan -hydrochlorothiazide  (DIOVAN  HCT) 80-12.5 MG tablet Take 1 tablet by mouth daily. 90 tablet 3   No current facility-administered medications for this visit.    PHYSICAL EXAMINATION: ECOG PERFORMANCE STATUS: 1 - Symptomatic but completely ambulatory  Vitals:   11/14/23 1038  BP: (!) 172/80  Pulse: 64  Resp: 18  Temp: (!) 97.5 F (36.4 C)  SpO2: 100%   Filed Weights   11/14/23 1038  Weight: 222 lb 9.6 oz (101 kg)    Physical Exam   (exam performed in the presence of a chaperone)  LABORATORY DATA:  I have reviewed the data as listed    Latest Ref Rng & Units 05/02/2023    4:24 PM 02/22/2022    9:01 AM 02/20/2018    1:46 PM  CMP  Glucose 70 - 99 mg/dL 891  891  878   BUN 8 - 27 mg/dL 10  11  19   Creatinine 0.57 - 1.00 mg/dL 9.18  8.97  8.81   Sodium 134 - 144 mmol/L 143  140  141   Potassium 3.5 - 5.2 mmol/L 3.8  4.0  3.6   Chloride 96 - 106 mmol/L 103  106  105   CO2 20 - 29 mmol/L 25  29  27    Calcium  8.7 - 10.3 mg/dL 9.4  9.4  9.6   Total Protein 6.5 - 8.1 g/dL  6.9  6.8   Total Bilirubin 0.3 - 1.2 mg/dL  0.5  0.4   Alkaline Phos 38 - 126 U/L  50  63   AST 15 - 41 U/L  22  21   ALT 0 - 44 U/L  15  16     Lab Results  Component Value Date   WBC 6.7 11/14/2023   HGB 14.9 11/14/2023   HCT 45.8 11/14/2023   MCV 86.1 11/14/2023   PLT 613 (H) 11/14/2023   NEUTROABS 4.0 11/14/2023     ASSESSMENT & PLAN:  Breast cancer of lower-inner quadrant of right female breast (HCC) Stage I right breast cancer diagnosed in 1984: Status post mastectomy, adjuvant chemotherapy and adjuvant antiestrogen therapy.  Currently on surveillance   Breast Cancer Surveillance: 1. Breast exam 01/16/2023: Benign no palpable lumps or nodules 2. Mammogram left breast 08/30/2023: Benign. Breast Density Category B.  Essential thrombocytosis (HCC) Lab review:  02/22/2021: Platelets 627 05/17/2023: Hemoglobin 15.2, platelets 496 11/14/2023: Hemoglobin 14.9, platelets 613 Plan:   Hydrea  3 times a week   Goal of treatment: To keep platelet count less than 600 Currently on hydroxyurea  3 times a week   Hydroxyurea  toxicities: Peeling of the lips   continue with anticoagulation for history of recurrent blood clots. Labs in 6 months and follow-up ------------------------------------- Assessment and Plan Assessment & Plan Essential thrombocythemia Platelet count is 613, slightly elevated. Goal is to maintain below 600. Current Hydrea  regimen is appropriate. Lifelong management necessary. - Continue Hydrea  500 mg orally three times a week. - Recheck blood work in six months.  Venous thromboembolism History of multiple venous thromboembolisms. On chronic anticoagulation with Xarelto  due to previous clots and INR variability. Indefinite anticoagulation advised. - Continue Xarelto  20 mg orally daily.  Malignant neoplasm of lower-inner quadrant of right breast, estrogen receptor positive (history of) Recent mammograms show no concerning findings. Breast tissue classified as B density. Ongoing monitoring recommended. - Continue regular mammogram screenings.      No orders of the defined types were placed in this encounter.  The patient has a good understanding of the overall plan. she agrees with it. she will call with any problems that may develop before the next visit here. Total time spent: 30  mins including face to face time and time spent for planning, charting and co-ordination of care   Lori K Javiel Canepa, MD 11/14/23

## 2024-05-09 ENCOUNTER — Ambulatory Visit: Admitting: Internal Medicine

## 2024-05-15 ENCOUNTER — Inpatient Hospital Stay

## 2024-05-15 ENCOUNTER — Inpatient Hospital Stay: Admitting: Hematology and Oncology
